# Patient Record
Sex: Male | Born: 1945 | Race: White | Hispanic: No | Marital: Married | Smoking: Never smoker
Health system: Southern US, Community
[De-identification: ages and names within clinical notes are randomized; demographics above are authoritative.]

## PROBLEM LIST (undated history)

## (undated) DIAGNOSIS — C61 Malignant neoplasm of prostate: Secondary | ICD-10-CM

## (undated) DIAGNOSIS — L814 Other melanin hyperpigmentation: Secondary | ICD-10-CM

## (undated) DIAGNOSIS — L111 Transient acantholytic dermatosis [Grover]: Secondary | ICD-10-CM

## (undated) DIAGNOSIS — E785 Hyperlipidemia, unspecified: Secondary | ICD-10-CM

## (undated) DIAGNOSIS — N400 Enlarged prostate without lower urinary tract symptoms: Secondary | ICD-10-CM

## (undated) DIAGNOSIS — I452 Bifascicular block: Secondary | ICD-10-CM

## (undated) DIAGNOSIS — R030 Elevated blood-pressure reading, without diagnosis of hypertension: Secondary | ICD-10-CM

## (undated) DIAGNOSIS — D1801 Hemangioma of skin and subcutaneous tissue: Secondary | ICD-10-CM

## (undated) DIAGNOSIS — Z973 Presence of spectacles and contact lenses: Secondary | ICD-10-CM

## (undated) DIAGNOSIS — L821 Other seborrheic keratosis: Secondary | ICD-10-CM

## (undated) DIAGNOSIS — N529 Male erectile dysfunction, unspecified: Secondary | ICD-10-CM

## (undated) HISTORY — PX: TONSILLECTOMY: SUR1361

## (undated) HISTORY — DX: Hemangioma of skin and subcutaneous tissue: D18.01

## (undated) HISTORY — DX: Other seborrheic keratosis: L82.1

## (undated) HISTORY — PX: KNEE ARTHROSCOPY: SUR90

## (undated) HISTORY — DX: Other melanin hyperpigmentation: L81.4

## (undated) HISTORY — DX: Transient acantholytic dermatosis (grover): L11.1

## (undated) HISTORY — PX: APPENDECTOMY: SHX54

## (undated) HISTORY — PX: SHOULDER ARTHROSCOPY WITH OPEN ROTATOR CUFF REPAIR: SHX6092

## (undated) HISTORY — PX: PROSTATE BIOPSY: SHX241

## (undated) HISTORY — PX: COLONOSCOPY: SHX174

---

## 1991-05-22 HISTORY — PX: INGUINAL HERNIA REPAIR: SUR1180

## 2003-11-11 ENCOUNTER — Ambulatory Visit (HOSPITAL_COMMUNITY): Admission: RE | Admit: 2003-11-11 | Discharge: 2003-11-11 | Payer: Self-pay

## 2011-05-22 DIAGNOSIS — C61 Malignant neoplasm of prostate: Secondary | ICD-10-CM | POA: Insufficient documentation

## 2011-05-22 HISTORY — DX: Malignant neoplasm of prostate: C61

## 2014-01-06 ENCOUNTER — Other Ambulatory Visit: Payer: Self-pay | Admitting: Urology

## 2014-01-06 DIAGNOSIS — C61 Malignant neoplasm of prostate: Secondary | ICD-10-CM

## 2014-02-02 ENCOUNTER — Ambulatory Visit (HOSPITAL_COMMUNITY)
Admission: RE | Admit: 2014-02-02 | Discharge: 2014-02-02 | Disposition: A | Discharge status: Home / Self Care | Payer: Medicare Other | Source: Ambulatory Visit | Attending: Urology | Admitting: Urology

## 2014-02-02 DIAGNOSIS — C61 Malignant neoplasm of prostate: Secondary | ICD-10-CM | POA: Diagnosis not present

## 2014-02-02 LAB — POCT I-STAT CREATININE: Creatinine, Ser: 1.1 mg/dL (ref 0.50–1.35)

## 2014-02-02 MED ORDER — GADOBENATE DIMEGLUMINE 529 MG/ML IV SOLN
20.0000 mL | Freq: Once | INTRAVENOUS | Status: AC | PRN
  Administered 2014-02-02: 16 mL via INTRAVENOUS

## 2015-10-04 DIAGNOSIS — N529 Male erectile dysfunction, unspecified: Secondary | ICD-10-CM | POA: Insufficient documentation

## 2015-10-04 DIAGNOSIS — E7800 Pure hypercholesterolemia, unspecified: Secondary | ICD-10-CM | POA: Insufficient documentation

## 2015-10-04 HISTORY — DX: Pure hypercholesterolemia, unspecified: E78.00

## 2015-10-04 HISTORY — DX: Male erectile dysfunction, unspecified: N52.9

## 2015-10-19 ENCOUNTER — Encounter: Payer: Self-pay | Admitting: Radiation Oncology

## 2015-10-19 NOTE — Progress Notes (Signed)
GU Location of Tumor / Histology: prostatic adenocarcinoma  Gleason score 6 in 2013. 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  William Lindsey STUDENT was initially diagnosed with prostate cancer in 2013 and place on surveillance  Biopsies of prostate (if applicable) revealed:      Past/Anticipated interventions by urology, if any: Multiple prostate biopsies. Proscar. Patient wishes to avoid future biopsies. Referral to Dr. Tammi Klippel  Past/Anticipated interventions by medical oncology, if any: no  Weight changes, if any: no  Bowel/Bladder complaints, if any: Denies hematuria or dysuria. Denies leakage or incontinence. IPSS 7. Reports Incomplete emptying and frequency less than 1 in 5. Reports urinary intermittency less than half the time. Reports urgency and weak stream less than 1 in 5. Reports nocturia x 1. Reports severe ED.  Nausea/Vomiting, if any: no  Pain issues, if any:  no  SAFETY ISSUES:  Prior radiation? no  Pacemaker/ICD? no  Possible current pregnancy? no  Is the patient on methotrexate? no  Current Complaints / other details:  70 year old male. Active playing golf several times per week. Prostate volume 73 cc. Daughter is a Pension scheme manager at [employer], Dr. Candice Camp. Also, patient has three sons. Patient questions if he should continue to take Proscar.

## 2015-10-20 ENCOUNTER — Ambulatory Visit
Admission: RE | Admit: 2015-10-20 | Discharge: 2015-10-20 | Disposition: A | Discharge status: Home / Self Care | Payer: Medicare Other | Source: Ambulatory Visit | Attending: Radiation Oncology | Admitting: Radiation Oncology

## 2015-10-20 ENCOUNTER — Encounter: Payer: Self-pay | Admitting: Radiation Oncology

## 2015-10-20 VITALS — BP 167/100 | HR 81 | Temp 98.1°F | Resp 16 | Ht 69.0 in | Wt 180.0 lb

## 2015-10-20 DIAGNOSIS — C61 Malignant neoplasm of prostate: Secondary | ICD-10-CM | POA: Diagnosis present

## 2015-10-20 DIAGNOSIS — Z510 Encounter for antineoplastic radiation therapy: Secondary | ICD-10-CM | POA: Insufficient documentation

## 2015-10-20 HISTORY — DX: Malignant neoplasm of prostate: C61

## 2015-10-20 NOTE — Progress Notes (Signed)
See progress note under physician encounter. 

## 2015-10-20 NOTE — Progress Notes (Signed)
Radiation Oncology         [phone] ________________________________  Initial outpatient Consultation  Name: William Lindsey MRN: [id number]  Date: 10/20/2015  DOB: 01-Nov-1945  [id number], MD  Alexis Frock, MD   REFERRING PHYSICIAN: Alexis Frock, MD  DIAGNOSIS: 70 y.o. gentleman with stage T1c adenocarcinoma of the prostate with a Gleason's score of 3+3 and a PSA of 7.96    ICD-9-CM ICD-10-CM   1. Malignant neoplasm of prostate ([place]) 185 C61   2. Prostate cancer ([place]) William Lindsey is a 70 y.o. gentleman with a history of prostate cancer originally diagnosed in 2013. His PSA in 2012 was found to be 4.12 which prompted an evaluation, though ultimately revealed a negative prostatic biopsy. He was followed with repeat PSA, and in May 2013, his PSA rose to 5.69, he had a Gleason 6 cancer in the Right lobe medially. He continued in surveillance and a PSA in August 2015 was 6.91 and a Prostate MRI at that time showed no focal lesions or evidence of metastases.  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 His prostatic volume measured 73 cc. He was started on Finasteride during that time, and despite initial decline of his PSA to 4.17 in November 2016, this has risen to 5.75 on 09/26/15. He reports that he was encourage to consider repeat biopsy, though states that he is at that point where he is ready to move forward with an active treatment rather than continue in surveillance, and because of the change in PSA while on Finasteride, he comes to further discuss options for intervention with Dr. Tammi Klippel. Of note, he has decided against moving forward with surgery because of his concerns postoperative incontinence and erectile function.  PREVIOUS RADIATION THERAPY: No  PAST MEDICAL HISTORY: Family History  Problem Relation Age of Onset  . Lung cancer Father   . Breast cancer Sister      PAST  SURGICAL HISTORY: Past Surgical History  Procedure Laterality Date  . Prostate biopsy      multiple    FAMILY HISTORY:  Family History  Problem Relation Age of Onset  . Lung cancer Father   . Breast cancer Sister     SOCIAL HISTORY:  reports that he has never smoked. He has never used smokeless tobacco. He reports that he does not drink alcohol or use illicit drugs. The patient is retired from working in Scientist, research (medical). He enjoys golfing. He is married and resides in [place] with his wife. His daughter is a Pension scheme manager in [place] at [employer].  ALLERGIES: Review of patient's allergies indicates no known allergies.  MEDICATIONS:  Current Outpatient Prescriptions  Medication Sig Dispense Refill  . atorvastatin (LIPITOR) 20 MG tablet Take by mouth.    . finasteride (PROSCAR) 5 MG tablet     . tadalafil (CIALIS) 5 MG tablet Take 5 mg by mouth.     No current facility-administered medications for this encounter.    REVIEW OF SYSTEMS:  On review of systems, the patient reports that he is doing well overall. He denies any chest pain, shortness of breath, cough, fevers, chills, night sweats, unintended weight changes. He denies abdominal pain, nausea or vomiting. He denies any new musculoskeletal or joint aches or pains. Denies hematuria or dysuria. Denies leakage or incontinence. Reports Incomplete emptying and frequency less than 1 in 5. Reports urinary intermittency less than half the time.  Reports urgency and weak stream less than 1 in 5. Reports nocturia x 1. Reports severe ED. A complete review of systems is obtained and is otherwise negative. The patient completed an IPSS and IIEF questionnaire.  His IPSS score was 7 indicating mild urinary outflow obstructive symptoms.  He indicated that his erectile function is poor and his ability to complete sexual activity is infrequent. A complete review of systems is obtained and is otherwise negative.   PHYSICAL EXAM:    height is 5\' 9"  (1.753  m) and weight is 180 lb (81.647 kg). His oral temperature is 98.1 F (36.7 C). His blood pressure is 167/100 and his pulse is 81. His respiration is 16 and oxygen saturation is 100%.   Pain scale 0/10 In general this is a well appearing caucasian male in no acute distress. He is alert and oriented x4 and appropriate throughout the examination. HEENT reveals that the patient is normocephalic, atraumatic. EOMs are intact. PERRLA. Skin is intact without any evidence of gross lesions. Cardiovascular exam reveals a regular rate and rhythm, no clicks rubs or murmurs are auscultated. Chest is clear to auscultation bilaterally. Lymphatic assessment is performed and does not reveal any adenopathy in the cervical, supraclavicular, axillary, or inguinal chains. Abdomen has active bowel sounds in all quadrants and is intact. The abdomen is soft, non tender, non distended. Lower extremities are negative for pretibial pitting edema, deep calf tenderness, cyanosis or clubbing.   KPS = 100  100 - Normal; no complaints; no evidence of disease. 90   - Able to carry on normal activity; minor signs or symptoms of disease. 80   - Normal activity with effort; some signs or symptoms of disease. 82   - Cares for self; unable to carry on normal activity or to do active work. 60   - Requires occasional assistance, but is able to care for most of his personal needs. 50   - Requires considerable assistance and frequent medical care. 26   - Disabled; requires special care and assistance. 72   - Severely disabled; hospital admission is indicated although death not imminent. 44   - Very sick; hospital admission necessary; active supportive treatment necessary. 10   - Moribund; fatal processes progressing rapidly. 0     - Dead  Karnofsky DA, Abelmann WH, Craver LS and Burchenal JH [phone]) The use of the nitrogen mustards in the palliative treatment of carcinoma: with particular reference to bronchogenic carcinoma Cancer 1  [id number]   LABORATORY DATA:  No results found for: WBC, HGB, HCT, MCV, PLT No results found for: NA, K, CL, CO2 No results found for: ALT, AST, GGT, ALKPHOS, BILITOT   RADIOGRAPHY: No results found.    IMPRESSION: This gentleman is a very pleasant 70 year old with a T1c Adenocarcinoma of the prostate with a Gleason's score of 3+3, and PSA of 5.75 on finasteride (though with this correction, his PSA is more likely in the 10 range).  His T-Stage, Gleason's Score, and corrected PSA put him into the intermediate risk group.  Accordingly he is eligible for a variety of potential treatment options including prostatectomy, seed implant, or external radiothearpy.  PLAN:  Dr. Tammi Klippel again reviews the patient's history, biopsies, and PSA levels. He reviews again the options for surgery which the patient is not interested in, and highlights discussion on radiotherapy options. He discusses the risks, benefits, short, and long term effects of radiation therapy in each modality of either brachytherapy with seed implant versus external radiotherapy.  He discusses the planning that goes into seed implant with a pubic arch study and preoperative testing, and reviews that alternatively, external radiotherapy would be for a total 40 fractions over 8 weeks, and that fiducial markers would need to be placed prior to this procedure.    After consideration, the patient would like to proceed with prostate brachytherapy. We will speak with Dr. Tresa Moore about scheduling a TRUS, as we would like his volume to be closer to 60cc prior to proceeding, and hopefully the finasteride may have helped in reduction of this volume.  Dr. Tammi Klippel will be in touch with the patient's daughter, Dr. Candice Camp, radiation oncologist at [facility], later this morning. We enjoyed meeting with him today, and will look forward to participating in the care of this very nice gentleman.  The above documentation reflects my direct findings during this shared  patient visit. Please see the separate note by Dr. Tammi Klippel on this date for the remainder of the patient's plan of care.    Carola Rhine, PAC     This document serves as a record of services personally performed by Tyler Pita, MD and Carola Rhine, PAC. It was created on his behalf by Arlyce Harman, a trained medical scribe. The creation of this record is based on the scribe's personal observations and the provider's statements to them. This document has been checked and approved by the attending provider.

## 2015-10-21 DIAGNOSIS — C61 Malignant neoplasm of prostate: Secondary | ICD-10-CM | POA: Insufficient documentation

## 2015-10-26 ENCOUNTER — Telehealth: Payer: Self-pay | Admitting: *Deleted

## 2015-10-26 NOTE — Telephone Encounter (Signed)
[facility] called, patient's appt prostate bx is scheduled for July 17,2017 patient to be there at [facility] for 930am bx, once Dr. Tresa Moore puts order in  She will call from their office to go over everything  With him, gave information to [facility] assistant\3:17 PM

## 2015-12-05 ENCOUNTER — Other Ambulatory Visit: Payer: Self-pay | Admitting: Radiation Oncology

## 2015-12-05 ENCOUNTER — Telehealth: Payer: Self-pay | Admitting: *Deleted

## 2015-12-05 NOTE — Telephone Encounter (Signed)
CALLED PATIENT TO INFORM OF PRE-SEED APPT. ON 12-09-15 @ 3 PM , LVM FOR A RETURN CALL

## 2015-12-08 ENCOUNTER — Telehealth: Payer: Self-pay | Admitting: *Deleted

## 2015-12-08 NOTE — Telephone Encounter (Signed)
CALLED PATIENT TO REMIND OF PRE-SEED APPT. FOR 12-09-15, SPOKE WITH PATIENT AND HE IS AWARE OF THIS APPT.

## 2015-12-08 NOTE — Telephone Encounter (Signed)
CALLED PATIENT TO ASK ABOUT COMING EARLIER TOMORROW FOR PRE-SEED APPT. ON 12-09-15 @ 1 PM, SPOKE WITH PATIENT AND HE AGREED TO THIS

## 2015-12-09 ENCOUNTER — Ambulatory Visit
Admission: RE | Admit: 2015-12-09 | Discharge: 2015-12-09 | Disposition: A | Discharge status: Home / Self Care | Payer: Medicare Other | Source: Ambulatory Visit | Attending: Radiation Oncology | Admitting: Radiation Oncology

## 2015-12-09 ENCOUNTER — Ambulatory Visit (HOSPITAL_BASED_OUTPATIENT_CLINIC_OR_DEPARTMENT_OTHER)
Admission: RE | Admit: 2015-12-09 | Discharge: 2015-12-09 | Disposition: A | Discharge status: Home / Self Care | Payer: Medicare Other | Source: Ambulatory Visit | Attending: Urology | Admitting: Urology

## 2015-12-09 ENCOUNTER — Other Ambulatory Visit: Payer: Self-pay

## 2015-12-09 ENCOUNTER — Encounter (HOSPITAL_BASED_OUTPATIENT_CLINIC_OR_DEPARTMENT_OTHER)
Admission: RE | Admit: 2015-12-09 | Discharge: 2015-12-09 | Disposition: A | Discharge status: Home / Self Care | Payer: Medicare Other | Source: Ambulatory Visit | Attending: Urology | Admitting: Urology

## 2015-12-09 DIAGNOSIS — I515 Myocardial degeneration: Secondary | ICD-10-CM | POA: Diagnosis not present

## 2015-12-09 DIAGNOSIS — C61 Malignant neoplasm of prostate: Secondary | ICD-10-CM

## 2015-12-09 DIAGNOSIS — Z01818 Encounter for other preprocedural examination: Secondary | ICD-10-CM | POA: Diagnosis not present

## 2015-12-09 DIAGNOSIS — Z510 Encounter for antineoplastic radiation therapy: Secondary | ICD-10-CM | POA: Diagnosis not present

## 2015-12-09 NOTE — Progress Notes (Signed)
  Radiation Oncology         [phone] ________________________________  Name: CEOLA MANIA MRN: [id number]  Date: 12/09/2015  DOB: February 05, 1946  SIMULATION AND TREATMENT PLANNING NOTE PUBIC ARCH STUDY  [id number], MD  Alexis Frock, MD  DIAGNOSIS: 70 y.o. gentleman with stage T1c adenocarcinoma of the prostate with a Gleason's score of 3+3 and a PSA of 7.96     ICD-9-CM ICD-10-CM   1. Prostate cancer ([place]) [place]:  The patient presented today for evaluation for possible prostate seed implant. He was brought to the radiation planning suite and placed supine on the CT couch. A 3-dimensional image study set was obtained in upload to the planning computer. There, on each axial slice, I contoured the prostate gland. Then, using three-dimensional radiation planning tools I reconstructed the prostate in view of the structures from the transperineal needle pathway to assess for possible pubic arch interference. In doing so, I did not appreciate any pubic arch interference. Also, the patient's prostate volume was estimated based on the drawn structure. The volume was 53 cc.  Given the pubic arch appearance and prostate volume, patient remains a good candidate to proceed with prostate seed implant. Today, he freely provided informed written consent to proceed.    PLAN: The patient will undergo prostate seed implant.   ________________________________  Sheral Apley. Tammi Klippel, M.D.

## 2016-01-17 ENCOUNTER — Ambulatory Visit: Admission: RE | Admit: 2016-01-17 | Payer: Medicare Other | Source: Ambulatory Visit

## 2016-01-18 ENCOUNTER — Telehealth: Payer: Self-pay | Admitting: *Deleted

## 2016-01-18 NOTE — Telephone Encounter (Signed)
CALLED PATIENT TO REMIND OF LABS FOR IMPLANT ON 01-26-16, SPOKE WITH PATIENT'S WIFE AND SHE IS AWARE OF THIS APPT.

## 2016-01-19 DIAGNOSIS — N401 Enlarged prostate with lower urinary tract symptoms: Secondary | ICD-10-CM | POA: Diagnosis not present

## 2016-01-19 DIAGNOSIS — Z79899 Other long term (current) drug therapy: Secondary | ICD-10-CM | POA: Diagnosis not present

## 2016-01-19 DIAGNOSIS — I452 Bifascicular block: Secondary | ICD-10-CM | POA: Diagnosis not present

## 2016-01-19 DIAGNOSIS — C61 Malignant neoplasm of prostate: Secondary | ICD-10-CM | POA: Diagnosis not present

## 2016-01-19 DIAGNOSIS — E785 Hyperlipidemia, unspecified: Secondary | ICD-10-CM | POA: Diagnosis not present

## 2016-01-19 DIAGNOSIS — R350 Frequency of micturition: Secondary | ICD-10-CM | POA: Diagnosis not present

## 2016-01-19 LAB — CBC
HCT: 42.4 % (ref 39.0–52.0)
HEMOGLOBIN: 14.5 g/dL (ref 13.0–17.0)
MCH: 30.5 pg (ref 26.0–34.0)
MCHC: 34.2 g/dL (ref 30.0–36.0)
MCV: 89.3 fL (ref 78.0–100.0)
PLATELETS: 250 10*3/uL (ref 150–400)
RBC: 4.75 MIL/uL (ref 4.22–5.81)
RDW: 12.6 % (ref 11.5–15.5)
WBC: 6.3 10*3/uL (ref 4.0–10.5)

## 2016-01-19 LAB — COMPREHENSIVE METABOLIC PANEL
ALBUMIN: 4.1 g/dL (ref 3.5–5.0)
ALK PHOS: 63 U/L (ref 38–126)
ALT: 21 U/L (ref 17–63)
AST: 22 U/L (ref 15–41)
Anion gap: 5 (ref 5–15)
BUN: 14 mg/dL (ref 6–20)
CALCIUM: 9 mg/dL (ref 8.9–10.3)
CHLORIDE: 107 mmol/L (ref 101–111)
CO2: 28 mmol/L (ref 22–32)
CREATININE: 1.27 mg/dL — AB (ref 0.61–1.24)
GFR calc Af Amer: >60 mL/min (ref >60)
GFR calc non Af Amer: 55 mL/min — ABNORMAL LOW (ref >60)
GLUCOSE: 115 mg/dL — AB (ref 65–99)
Potassium: 3.9 mmol/L (ref 3.5–5.1)
SODIUM: 140 mmol/L (ref 135–145)
Total Bilirubin: 1 mg/dL (ref 0.3–1.2)
Total Protein: 8 g/dL (ref 6.5–8.1)

## 2016-01-19 LAB — PROTIME-INR
INR: 1.06
Prothrombin Time: 13.8 seconds (ref 11.4–15.2)

## 2016-01-19 LAB — APTT: APTT: 25 s (ref 24–36)

## 2016-01-20 ENCOUNTER — Encounter (HOSPITAL_BASED_OUTPATIENT_CLINIC_OR_DEPARTMENT_OTHER): Payer: Self-pay | Admitting: *Deleted

## 2016-01-20 NOTE — Progress Notes (Signed)
NPO AFTER MN.  ARRIVE AT 0800.  CURRENT LAB RESULTS, EKG, AND CXR IN CHART AND EPIC.  WILL DO FLEET ENEMA AM DOS .

## 2016-01-25 ENCOUNTER — Telehealth: Payer: Self-pay | Admitting: *Deleted

## 2016-01-25 NOTE — Telephone Encounter (Signed)
CALLED PATIENT TO REMIND OF PROCEDURE FOR 01-26-16, SPOKE WITH PATIENT AND HE IS AWARE OF THIS PROCEDURE

## 2016-01-26 ENCOUNTER — Ambulatory Visit (HOSPITAL_COMMUNITY): Payer: Medicare Other

## 2016-01-26 ENCOUNTER — Ambulatory Visit (HOSPITAL_BASED_OUTPATIENT_CLINIC_OR_DEPARTMENT_OTHER)
Admission: RE | Admit: 2016-01-26 | Discharge: 2016-01-26 | Disposition: A | Discharge status: Home / Self Care | Payer: Medicare Other | Source: Ambulatory Visit | Attending: Urology | Admitting: Urology

## 2016-01-26 ENCOUNTER — Encounter (HOSPITAL_BASED_OUTPATIENT_CLINIC_OR_DEPARTMENT_OTHER)
Admission: RE | Disposition: A | Discharge status: Home / Self Care | Payer: Self-pay | Source: Ambulatory Visit | Attending: Urology

## 2016-01-26 ENCOUNTER — Ambulatory Visit (HOSPITAL_BASED_OUTPATIENT_CLINIC_OR_DEPARTMENT_OTHER): Payer: Medicare Other | Admitting: Anesthesiology

## 2016-01-26 ENCOUNTER — Encounter (HOSPITAL_BASED_OUTPATIENT_CLINIC_OR_DEPARTMENT_OTHER): Payer: Self-pay | Admitting: Anesthesiology

## 2016-01-26 DIAGNOSIS — Z01818 Encounter for other preprocedural examination: Secondary | ICD-10-CM

## 2016-01-26 DIAGNOSIS — R350 Frequency of micturition: Secondary | ICD-10-CM | POA: Insufficient documentation

## 2016-01-26 DIAGNOSIS — C61 Malignant neoplasm of prostate: Secondary | ICD-10-CM | POA: Diagnosis not present

## 2016-01-26 DIAGNOSIS — I452 Bifascicular block: Secondary | ICD-10-CM | POA: Diagnosis not present

## 2016-01-26 DIAGNOSIS — N401 Enlarged prostate with lower urinary tract symptoms: Secondary | ICD-10-CM | POA: Insufficient documentation

## 2016-01-26 DIAGNOSIS — E785 Hyperlipidemia, unspecified: Secondary | ICD-10-CM | POA: Diagnosis not present

## 2016-01-26 DIAGNOSIS — Z79899 Other long term (current) drug therapy: Secondary | ICD-10-CM | POA: Insufficient documentation

## 2016-01-26 HISTORY — DX: Male erectile dysfunction, unspecified: N52.9

## 2016-01-26 HISTORY — DX: Presence of spectacles and contact lenses: Z97.3

## 2016-01-26 HISTORY — DX: Hyperlipidemia, unspecified: E78.5

## 2016-01-26 HISTORY — DX: Benign prostatic hyperplasia without lower urinary tract symptoms: N40.0

## 2016-01-26 HISTORY — PX: RADIOACTIVE SEED IMPLANT: SHX5150

## 2016-01-26 HISTORY — DX: Bifascicular block: I45.2

## 2016-01-26 HISTORY — DX: Elevated blood-pressure reading, without diagnosis of hypertension: R03.0

## 2016-01-26 SURGERY — INSERTION, RADIATION SOURCE, PROSTATE
Anesthesia: General | Site: Prostate

## 2016-01-26 MED ORDER — DEXAMETHASONE SODIUM PHOSPHATE 4 MG/ML IJ SOLN
INTRAMUSCULAR | Status: DC | PRN
  Administered 2016-01-26: 10 mg via INTRAVENOUS

## 2016-01-26 MED ORDER — TRAMADOL HCL 50 MG PO TABS: 50.0000 mg | ORAL_TABLET | Freq: Four times a day (QID) | ORAL | 0 refills | Status: DC | PRN

## 2016-01-26 MED ORDER — CEFAZOLIN SODIUM 1 G IJ SOLR
INTRAMUSCULAR | Status: AC
  Filled 2016-01-26: qty 10

## 2016-01-26 MED ORDER — FENTANYL CITRATE (PF) 100 MCG/2ML IJ SOLN
INTRAMUSCULAR | Status: DC | PRN
  Administered 2016-01-26 (×2): 50 ug via INTRAVENOUS

## 2016-01-26 MED ORDER — LACTATED RINGERS IV SOLN
INTRAVENOUS | Status: DC
Start: 2016-01-26 — End: 2016-01-26
  Administered 2016-01-26 (×3): via INTRAVENOUS
  Filled 2016-01-26: qty 1000

## 2016-01-26 MED ORDER — LIDOCAINE 2% (20 MG/ML) 5 ML SYRINGE
INTRAMUSCULAR | Status: AC
  Filled 2016-01-26: qty 15

## 2016-01-26 MED ORDER — CEFAZOLIN IN D5W 1 GM/50ML IV SOLN
INTRAVENOUS | Status: DC | PRN
  Administered 2016-01-26: 1 g via INTRAVENOUS

## 2016-01-26 MED ORDER — IOHEXOL 300 MG/ML  SOLN
INTRAMUSCULAR | Status: DC | PRN
  Administered 2016-01-26: 7 mL

## 2016-01-26 MED ORDER — DEXAMETHASONE SODIUM PHOSPHATE 10 MG/ML IJ SOLN
INTRAMUSCULAR | Status: AC
  Filled 2016-01-26: qty 1

## 2016-01-26 MED ORDER — MIDAZOLAM HCL 5 MG/5ML IJ SOLN
INTRAMUSCULAR | Status: DC | PRN
  Administered 2016-01-26: 2 mg via INTRAVENOUS

## 2016-01-26 MED ORDER — LIDOCAINE 2% (20 MG/ML) 5 ML SYRINGE
INTRAMUSCULAR | Status: DC | PRN
  Administered 2016-01-26: 80 mg via INTRAVENOUS

## 2016-01-26 MED ORDER — MEPERIDINE HCL 25 MG/ML IJ SOLN
6.2500 mg | INTRAMUSCULAR | Status: DC | PRN
  Filled 2016-01-26: qty 1

## 2016-01-26 MED ORDER — PROPOFOL 10 MG/ML IV BOLUS
INTRAVENOUS | Status: AC
  Filled 2016-01-26: qty 20

## 2016-01-26 MED ORDER — PROPOFOL 500 MG/50ML IV EMUL
INTRAVENOUS | Status: DC | PRN
  Administered 2016-01-26: 25 ug/kg/min via INTRAVENOUS

## 2016-01-26 MED ORDER — ONDANSETRON HCL 4 MG/2ML IJ SOLN
4.0000 mg | Freq: Once | INTRAMUSCULAR | Status: DC | PRN
  Filled 2016-01-26: qty 2

## 2016-01-26 MED ORDER — CIPROFLOXACIN IN D5W 400 MG/200ML IV SOLN
INTRAVENOUS | Status: AC
  Filled 2016-01-26: qty 200

## 2016-01-26 MED ORDER — ONDANSETRON HCL 4 MG/2ML IJ SOLN
INTRAMUSCULAR | Status: DC | PRN
  Administered 2016-01-26: 4 mg via INTRAVENOUS

## 2016-01-26 MED ORDER — HYDROMORPHONE HCL 1 MG/ML IJ SOLN
0.2500 mg | INTRAMUSCULAR | Status: DC | PRN
  Filled 2016-01-26: qty 1

## 2016-01-26 MED ORDER — PROPOFOL 10 MG/ML IV BOLUS
INTRAVENOUS | Status: DC | PRN
  Administered 2016-01-26: 170 mg via INTRAVENOUS
  Administered 2016-01-26: 30 mg via INTRAVENOUS

## 2016-01-26 MED ORDER — FENTANYL CITRATE (PF) 100 MCG/2ML IJ SOLN
INTRAMUSCULAR | Status: AC
  Filled 2016-01-26: qty 2

## 2016-01-26 MED ORDER — PROPOFOL 10 MG/ML IV BOLUS
INTRAVENOUS | Status: AC
  Filled 2016-01-26: qty 40

## 2016-01-26 MED ORDER — ARTIFICIAL TEARS OP OINT
TOPICAL_OINTMENT | OPHTHALMIC | Status: AC
  Filled 2016-01-26: qty 3.5

## 2016-01-26 MED ORDER — FLEET ENEMA 7-19 GM/118ML RE ENEM
1.0000 | ENEMA | Freq: Once | RECTAL | Status: AC
  Administered 2016-01-26: 1 via RECTAL
  Filled 2016-01-26: qty 1

## 2016-01-26 MED ORDER — MIDAZOLAM HCL 2 MG/2ML IJ SOLN
INTRAMUSCULAR | Status: AC
  Filled 2016-01-26: qty 2

## 2016-01-26 MED ORDER — KETOROLAC TROMETHAMINE 30 MG/ML IJ SOLN
INTRAMUSCULAR | Status: DC | PRN
  Administered 2016-01-26: 30 mg via INTRAVENOUS

## 2016-01-26 MED ORDER — CIPROFLOXACIN IN D5W 400 MG/200ML IV SOLN
400.0000 mg | INTRAVENOUS | Status: AC
  Administered 2016-01-26: 400 mg via INTRAVENOUS
  Filled 2016-01-26: qty 200

## 2016-01-26 MED ORDER — KETOROLAC TROMETHAMINE 30 MG/ML IJ SOLN
INTRAMUSCULAR | Status: AC
  Filled 2016-01-26: qty 1

## 2016-01-26 MED ORDER — PHENYLEPHRINE 40 MCG/ML (10ML) SYRINGE FOR IV PUSH (FOR BLOOD PRESSURE SUPPORT)
PREFILLED_SYRINGE | INTRAVENOUS | Status: AC
  Filled 2016-01-26: qty 10

## 2016-01-26 MED ORDER — ONDANSETRON HCL 4 MG/2ML IJ SOLN
INTRAMUSCULAR | Status: AC
  Filled 2016-01-26: qty 2

## 2016-01-26 MED ORDER — EPHEDRINE SULFATE 50 MG/ML IJ SOLN
INTRAMUSCULAR | Status: DC | PRN
  Administered 2016-01-26 (×3): 10 mg via INTRAVENOUS

## 2016-01-26 SURGICAL SUPPLY — 35 items
BAG URINE DRAINAGE (UROLOGICAL SUPPLIES) ×3 IMPLANT
BLADE CLIPPER SURG (BLADE) ×3 IMPLANT
CATH FOLEY 2WAY SLVR  5CC 16FR (CATHETERS) ×2
CATH FOLEY 2WAY SLVR 5CC 16FR (CATHETERS) ×2 IMPLANT
CATH ROBINSON RED A/P 20FR (CATHETERS) ×3 IMPLANT
CLOTH BEACON ORANGE TIMEOUT ST (SAFETY) ×3 IMPLANT
COVER BACK TABLE 60X90IN (DRAPES) ×3 IMPLANT
COVER MAYO STAND STRL (DRAPES) ×3 IMPLANT
DRSG TEGADERM 4X4.75 (GAUZE/BANDAGES/DRESSINGS) ×3 IMPLANT
DRSG TEGADERM 8X12 (GAUZE/BANDAGES/DRESSINGS) ×3 IMPLANT
GLOVE BIO SURGEON STRL SZ 6.5 (GLOVE) ×1 IMPLANT
GLOVE BIO SURGEON STRL SZ7.5 (GLOVE) ×2 IMPLANT
GLOVE BIO SURGEONS STRL SZ 6.5 (GLOVE) ×1
GLOVE BIOGEL PI IND STRL 6.5 (GLOVE) IMPLANT
GLOVE BIOGEL PI IND STRL 7.5 (GLOVE) IMPLANT
GLOVE BIOGEL PI INDICATOR 6.5 (GLOVE) ×4
GLOVE BIOGEL PI INDICATOR 7.5 (GLOVE) ×2
GLOVE ECLIPSE 8.0 STRL XLNG CF (GLOVE) ×18 IMPLANT
GOWN STRL REUS W/ TWL LRG LVL3 (GOWN DISPOSABLE) ×1 IMPLANT
GOWN STRL REUS W/ TWL XL LVL3 (GOWN DISPOSABLE) ×1 IMPLANT
GOWN STRL REUS W/TWL LRG LVL3 (GOWN DISPOSABLE) ×3
GOWN STRL REUS W/TWL XL LVL3 (GOWN DISPOSABLE) ×3
HOLDER FOLEY CATH W/STRAP (MISCELLANEOUS) ×3 IMPLANT
IV NS 1000ML (IV SOLUTION) ×3
IV NS 1000ML BAXH (IV SOLUTION) ×1 IMPLANT
KIT ROOM TURNOVER WOR (KITS) ×3 IMPLANT
MANIFOLD NEPTUNE II (INSTRUMENTS) IMPLANT
NUCLETRON SELECTSEED 1-125 (Urological Implant) ×156 IMPLANT
PACK CYSTO (CUSTOM PROCEDURE TRAY) ×3 IMPLANT
SPONGE GAUZE 4X4 12PLY STER LF (GAUZE/BANDAGES/DRESSINGS) ×2 IMPLANT
SYRINGE 10CC LL (SYRINGE) ×3 IMPLANT
TUBE CONNECTING 12'X1/4 (SUCTIONS)
TUBE CONNECTING 12X1/4 (SUCTIONS) IMPLANT
UNDERPAD 30X30 INCONTINENT (UNDERPADS AND DIAPERS) ×6 IMPLANT
WATER STERILE IRR 500ML POUR (IV SOLUTION) ×3 IMPLANT

## 2016-01-26 NOTE — Anesthesia Procedure Notes (Signed)
Procedure Name: LMA Insertion Date/Time: 01/26/2016 10:06 AM Performed by: Wanita Chamberlain Pre-anesthesia Checklist: Patient identified, Timeout performed, Emergency Drugs available, Suction available and Patient being monitored Patient Re-evaluated:Patient Re-evaluated prior to inductionOxygen Delivery Method: Circle system utilized Preoxygenation: Pre-oxygenation with 100% oxygen Intubation Type: IV induction Ventilation: Mask ventilation without difficulty LMA: LMA inserted LMA Size: 4.0 Number of attempts: 1 Placement Confirmation: positive ETCO2 Tube secured with: Tape

## 2016-01-26 NOTE — Anesthesia Postprocedure Evaluation (Signed)
Anesthesia Post Note  Patient: William Lindsey  Procedure(s) Performed: Procedure(s) (LRB): RADIOACTIVE SEED IMPLANT/BRACHYTHERAPY IMPLANT (N/A)  Patient location during evaluation: PACU Anesthesia Type: General Level of consciousness: awake and alert Pain management: pain level controlled Vital Signs Assessment: post-procedure vital signs reviewed and stable Respiratory status: spontaneous breathing, nonlabored ventilation, respiratory function stable and patient connected to nasal cannula oxygen Cardiovascular status: blood pressure returned to baseline and stable Postop Assessment: no signs of nausea or vomiting Anesthetic complications: no    Last Vitals:  Vitals:   01/26/16 1245 01/26/16 1342  BP: (!) 144/89 (!) 148/85  Pulse: 75 76  Resp: 16 16  Temp:  36.6 C    Last Pain:  Vitals:   01/26/16 1245  TempSrc:   PainSc: 0-No pain                 William Lindsey William Lindsey

## 2016-01-26 NOTE — Anesthesia Preprocedure Evaluation (Addendum)
Anesthesia Evaluation  Patient identified by MRN, date of birth, ID band Patient awake    Reviewed: Allergy & Precautions, NPO status , Patient's Chart, lab work & pertinent test results  Airway Mallampati: I  TM Distance: >3 FB Neck ROM: Full    Dental  (+) Teeth Intact, Dental Advisory Given, Loose,    Pulmonary    Pulmonary exam normal        Cardiovascular Exercise Tolerance: Good Normal cardiovascular exam+ dysrhythmias   Bifascicular block   Neuro/Psych negative neurological ROS  negative psych ROS   GI/Hepatic negative GI ROS, Neg liver ROS,   Endo/Other  negative endocrine ROS  Renal/GU Prostate CA     Musculoskeletal negative musculoskeletal ROS (+)   Abdominal   Peds  Hematology negative hematology ROS (+)   Anesthesia Other Findings   Reproductive/Obstetrics negative OB ROS                           Anesthesia Physical Anesthesia Plan  ASA: II  Anesthesia Plan: General   Post-op Pain Management:    Induction: Intravenous  Airway Management Planned: LMA  Additional Equipment:   Intra-op Plan:   Post-operative Plan: Extubation in OR  Informed Consent: I have reviewed the patients History and Physical, chart, labs and discussed the procedure including the risks, benefits and alternatives for the proposed anesthesia with the patient or authorized representative who has indicated his/her understanding and acceptance.     Plan Discussed with: CRNA and Surgeon  Anesthesia Plan Comments:         Anesthesia Quick Evaluation

## 2016-01-26 NOTE — Progress Notes (Signed)
  Radiation Oncology         [phone] ________________________________  Name: William Lindsey MRN: [id number]  Date: 01/26/2016  DOB: 1945/11/17       Prostate Seed Implant  [id number], MD  No ref. provider found  DIAGNOSIS:  70 y.o. gentleman with stage T1c adenocarcinoma of the prostate with a Gleason's score of 3+3 and a PSA of 7.96     ICD-9-CM ICD-10-CM   1. Pre-op testing V72.84 Z01.818 DG Chest 2 View     DG Chest 2 View    PROCEDURE: Insertion of radioactive I-125 seeds into the prostate gland.  RADIATION DOSE: 145 Gy, definitive therapy.  TECHNIQUE: [name] was brought to the operating room with the urologist. He was placed in the dorsolithotomy position. He was catheterized and a rectal tube was inserted. The perineum was shaved, prepped and draped. The ultrasound probe was then introduced into the rectum to see the prostate gland.  TREATMENT DEVICE: A needle grid was attached to the ultrasound probe stand and anchor needles were placed.  3D PLANNING: The prostate was imaged in 3D using a sagittal sweep of the prostate probe. These images were transferred to the planning computer. There, the prostate, urethra and rectum were defined on each axial reconstructed image. Then, the software created an optimized 3D plan and a few seed positions were adjusted. The quality of the plan was reviewed using [facility] information for the target and the following two organs at risk:  Urethra and Rectum.  Then the accepted plan was uploaded to the seed Selectron afterloading unit.  PROSTATE VOLUME STUDY:  Using transrectal ultrasound the volume of the prostate was verified to be 65 cc.  SPECIAL TREATMENT PROCEDURE/SUPERVISION AND HANDLING: The Nucletron FIRST system was used to place the needles under sagittal guidance. A total of 23 needles were used to deposit 78 seeds in the prostate gland. The individual seed activity was 0.546 mCi.  COMPLEX SIMULATION: At the end of the  procedure, an anterior radiograph of the pelvis was obtained to document seed positioning and count. Cystoscopy was performed to check the urethra and bladder.  MICRODOSIMETRY: At the end of the procedure, the patient was emitting 0.24 mR/hr at 1 meter. Accordingly, he was considered safe for hospital discharge.  PLAN: The patient will return to the radiation oncology clinic for post implant CT dosimetry in three weeks.   ________________________________  Sheral Apley Tammi Klippel, M.D.

## 2016-01-26 NOTE — Brief Op Note (Signed)
01/26/2016  11:37 AM  PATIENT:  William Lindsey  70 y.o. male  PRE-OPERATIVE DIAGNOSIS:  PROSTATE CANCER  POST-OPERATIVE DIAGNOSIS:  PROSTATE CANCER  PROCEDURE:  Procedure(s): RADIOACTIVE SEED IMPLANT/BRACHYTHERAPY IMPLANT (N/A)  SURGEON:  Surgeon(s) and Role:    * William Frock, MD - Primary    * Tyler Pita, MD - Assisting  PHYSICIAN ASSISTANT:   ASSISTANTS: none   ANESTHESIA:   general  EBL:  Total I/O In: 1000 [I.V.:1000] Out: 30 [Urine:25; Blood:5]  BLOOD ADMINISTERED:none  DRAINS: none   LOCAL MEDICATIONS USED:  NONE  SPECIMEN:  No Specimen  DISPOSITION OF SPECIMEN:  N/A  COUNTS:  YES  TOURNIQUET:  * No tourniquets in log *  DICTATION: .Other Dictation: Dictation Number [id number]  PLAN OF CARE: Discharge to home after PACU  PATIENT DISPOSITION:  PACU - hemodynamically stable.   Delay start of Pharmacological VTE agent (>24hrs) due to surgical blood loss or risk of bleeding: yes

## 2016-01-26 NOTE — Transfer of Care (Signed)
Immediate Anesthesia Transfer of Care Note  Patient: William Lindsey  Procedure(s) Performed: Procedure(s): RADIOACTIVE SEED IMPLANT/BRACHYTHERAPY IMPLANT (N/A)  Patient Location: PACU  Anesthesia Type:General  Level of Consciousness: sedated and patient cooperative  Airway & Oxygen Therapy: Patient Spontanous Breathing and Patient connected to nasal cannula oxygen  Post-op Assessment: Report given to RN and Post -op Vital signs reviewed and stable  Post vital signs: Reviewed and stable  Last Vitals:  Vitals:   01/26/16 0815  BP: (!) 158/86  Pulse: 80  Resp: 16  Temp: 37.2 C    Last Pain:  Vitals:   01/26/16 0815  TempSrc: Oral      Patients Stated Pain Goal: 6 ([phone])  Complications: No apparent anesthesia complications

## 2016-01-26 NOTE — Discharge Instructions (Signed)
1 - You may have urinary urgency (bladder spasms) and bloody urine on / off  X few days. This is normal.  2 - Call MD or go to ER for fever >102, severe pain / nausea / vomiting not relieved by medications, inability to pass urine, or acute change in medical status  Brachytherapy for Prostate Cancer, Care After Refer to this sheet in the next few weeks. These instructions provide you with information on caring for yourself after your procedure. Your health care provider may also give you more specific instructions. Your treatment has been planned according to current medical practices, but problems sometimes occur. Call your health care provider if you have any problems or questions after your procedure. WHAT TO EXPECT AFTER THE PROCEDURE The area behind the scrotum will probably be tender and bruised. For a short period of time you may have:  Difficulty passing urine. You may need a catheter for a few days to a month.  Blood in the urine or semen.  A feeling of constipation because of prostate swelling.  Frequent feeling of an urgent need to urinate. For a long period of time you may have:  Inflammation of the rectum. This happens in about 2% of people who have the procedure.  Erection problems. These vary with age and occur in about 15-40% of men.  Difficulty urinating. This is caused by scarring in the urethra.  Diarrhea. HOME CARE INSTRUCTIONS   Take medicines only as directed by your health care provider.  You will probably have a catheter in your bladder for several days. You will have blood in the urine bag and should drink a lot of fluids to keep it a light red color.  Keep all follow-up visits as directed by your health care provider. If you have a catheter, it will be removed during one of these visits.  Try not to sit directly on the area behind the scrotum. A soft cushion can decrease the discomfort. Ice packs may also be helpful for the discomfort. Do not put ice  directly on the skin.  Shower and wash the area behind the scrotum gently. Do not sit in a tub.  If you have had the brachytherapy that uses the seeds, limit your close contact with children and pregnant women for 2 months because of the radiation still in the prostate. After that period of time, the levels drop off quickly. SEEK IMMEDIATE MEDICAL CARE IF:   You have a fever.  You have chills.  You have shortness of breath.  You have chest pain.  You have thick blood, like tomato juice, in the urine bag.  Your catheter is blocked so urine cannot get into the bag. Your bladder area or lower abdomen may be swollen.  There is excessive bleeding from your rectum. It is normal to have a little blood mixed with your stool.  There is severe discomfort in the treated area that does not go away with pain medicine.  You have abdominal discomfort.  You have severe nausea or vomiting.  You develop any new or unusual symptoms.   This information is not intended to replace advice given to you by your health care provider. Make sure you discuss any questions you have with your health care provider.   Document Released: 06/09/2010 Document Revised: 05/28/2014 Document Reviewed: 10/28/2012 Elsevier Interactive Patient Education 2016 [place] Instructions   Activity:    Rest for the remainder of the day.  Do  not drive or operate equipment today.  You may resume normal  activities in a few days as instructed by your physician, without risk of harmful radiation exposure to those around you, provided you follow the time and distance precautions on the Radiation Oncology Instruction Sheet.  Meals: Drink plenty of lipuids and eat light foods, such as gelatin or soup this evening .  You may return to normal meal plan tomorrow.  Return To Work: You may return to work as instructed by Naval architect.  Special Instruction:   If any seeds are found, use  tweezers to pick up seeds and place in a glass container of any kind and bring to your physician's office.  Call your physician if any of these symptoms occur:   Persistent or heavy bleeding  Urine stream diminishes or stops completely after catheter is removed  Fever equal to or greater than 101 degrees F  Cloudy urine with a strong foul odor  Severe pain  You may feel some burning pain and/or hesitancy when you urinate after the catheter is removed.  These symptoms may increase over the next few weeks, but should diminish within forur to six weeks.  Applying moist heat to the lower abdomen or a hot tub bath may help relieve the pain.  If the discomfort becomes severe, please call your physician for additional medications.  Remove perineal dressing tonight  Or in am.  Leave area open to air  Post Anesthesia Home Care Instructions  Activity: Get plenty of rest for the remainder of the day. A responsible adult should stay with you for 24 hours following the procedure.  For the next 24 hours, DO NOT: -Drive a car -Paediatric nurse -Drink alcoholic beverages -Take any medication unless instructed by your physician -Make any legal decisions or sign important papers.  Meals: Start with liquid foods such as gelatin or soup. Progress to regular foods as tolerated. Avoid greasy, spicy, heavy foods. If nausea and/or vomiting occur, drink only clear liquids until the nausea and/or vomiting subsides. Call your physician if vomiting continues.  Special Instructions/Symptoms: Your throat may feel dry or sore from the anesthesia or the breathing tube placed in your throat during surgery. If this causes discomfort, gargle with warm salt water. The discomfort should disappear within 24 hours.  If you had a scopolamine patch placed behind your ear for the management of post- operative nausea and/or vomiting:  1. The medication in the patch is effective for 72 hours, after which it should be  removed.  Wrap patch in a tissue and discard in the trash. Wash hands thoroughly with soap and water. 2. You may remove the patch earlier than 72 hours if you experience unpleasant side effects which may include dry mouth, dizziness or visual disturbances. 3. Avoid touching the patch. Wash your hands with soap and water after contact with the patch.

## 2016-01-26 NOTE — H&P (Signed)
William Lindsey is an 70 y.o. male.    Chief Complaint: Pre-op Brachytherapy Implantation  HPI:   1 - Moderate Risk Prostate Cancer - initial DX 2013 with Gleason 6 and placed on surveillance    Recent course:   09/2011 - PSA 5.69 --> Gleason 6 RLM (TRUS 31mL)   10/2012 - PSA 5.96 -> Gleason 6 RMA (TRUS 61mL)   12/2013 - PSA 6.91 --> Prostate MRI w/o large focal lesions   07/2014 - PSA 7.96 --> Atypia LLB (no carcinoma, TRUS 49mL); 03/2015 PSA 4.17 (on finasteride) --> plan to refrain from further BX / MRI unless sustained PSA rise   09/2015 PSA 5.75 (on finasteride) --> TRUS 49mL as part of radiation planning on finasteride (was orrig 68mL).    2 - Enlarged Prostate with Urinary Frequency - slowly progressive bother from mostly irritative voding sympotms. Placed on finasteride 07/2014.    PMH sig for left inguinal hernia repair, appy. His PCP is Jerilynn Birkenhead MD with [facility]. He is remarkably active and plays golf often, NO CV disease. NO blood thinners. His daughter, Candice Camp, is a Pension scheme manager at [employer] in breast / intracranial.    Today [place] is seen to proceed with prostate brachytherapy seed implantation.    Past Medical History:  Diagnosis Date  . Borderline hypertension    no meds  . BPH (benign prostatic hyperplasia)   . ED (erectile dysfunction)   . Hyperlipidemia   . Prostate cancer [facility]) dx 2013--  urologist-  dr Tresa Moore  oncologist-  dr Tammi Klippel   moderate risk T1c, Gleason 3+3,  PSA 5.75,  vol 66ml -- planned Radioactive seed implants 01-26-2016  . 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 Wears glasses     Past Surgical History:  Procedure Laterality Date  . APPENDECTOMY  teen  . COLONOSCOPY  last one 07/ 2017  . INGUINAL HERNIA REPAIR Left 1993  . KNEE ARTHROSCOPY Bilateral last one -- left 01/ 2017  . PROSTATE BIOPSY     multiple  . SHOULDER ARTHROSCOPY WITH OPEN ROTATOR CUFF REPAIR Left early  2000's  . TONSILLECTOMY  child    Family History  Problem Relation Age of Onset  . Lung cancer Father   . Breast cancer Sister    Social History:  reports that he has never smoked. He has never used smokeless tobacco. He reports that he does not drink alcohol or use drugs.  Allergies: No Known Allergies  No prescriptions prior to admission.    No results found for this or any previous visit (from the past 48 hour(s)). No results found.  Review of Systems  Constitutional: Negative.  Negative for chills and fever.  HENT: Negative.   Eyes: Negative.   Respiratory: Negative.   Cardiovascular: Negative.   Gastrointestinal: Negative.   Genitourinary: Negative.   Musculoskeletal: Negative.   Skin: Negative.   Neurological: Negative.   Endo/Heme/Allergies: Negative.     Height 5\' 9"  (1.753 m), weight 79.4 kg (175 lb). Physical Exam  Constitutional: He appears well-developed.  HENT:  Head: Normocephalic.  Eyes: Pupils are equal, round, and reactive to light.  Cardiovascular: Normal rate.   Respiratory: Effort normal.  GI: Soft.  Genitourinary:  Genitourinary Comments: No CVAT  Musculoskeletal: Normal range of motion.  Neurological: He is alert.  Skin: Skin is warm.  Psychiatric: He has a normal mood and affect. His behavior is normal. Judgment and thought content normal.     Assessment/Plan  Proceed with curative intent radiation therapy for clinically localized moderate risk prostate cancer. Discussed expected worsening of obstructive symptoms that will hopefully be transient and minor.  Risks, benefits, alternatives, expected peri-procedure course discussed extensively previously and reierated today.     Alexis Frock, MD 01/26/2016, 6:07 AM

## 2016-01-27 ENCOUNTER — Encounter (HOSPITAL_BASED_OUTPATIENT_CLINIC_OR_DEPARTMENT_OTHER): Payer: Self-pay | Admitting: Urology

## 2016-01-27 NOTE — Op Note (Signed)
NAMEMarland Kitchen  MAL, GRISCOM NO.:  [id number]  MEDICAL RECORD NO.:  [id number]  LOCATION:                                 FACILITY:  PHYSICIAN:  Alexis Frock, MD     DATE OF BIRTH:  08-15-1945  DATE OF PROCEDURE: 01/26/2016                              OPERATIVE REPORT   DIAGNOSIS:  Prostate cancer.  PROCEDURES: 1. Cystoscopy. 2. Brachytherapy, seed implantation.  ESTIMATED BLOOD LOSS:  Nil.  COMPLICATION:  None.  SPECIMEN:  None.  FINDINGS:  Unremarkable bladder urethra without any evidence of seeds in the urinary tract.  Prescribed dose 145 cGy.  Total number of seed is 78.  RADIATION ONCOLOGIST:  Lora Paula, M.D.  INDICATIONS:  [name] is a very pleasant and quite vigorous 70 year old gentleman with history of adenocarcinoma of the prostate, which has been initially on surveillance.  He has had PSA elevation and as listed to transition toward curative intent therapy with brachytherapy.  He underwent Radiation Oncology consultation with Dr. Tammi Klippel, who also corroborated he is a satisfactory candidate.  Informed consent was obtained and placed in the medical record.  PROCEDURE IN DETAIL:  The patient being William Lindsey, was verified. Procedure being brachytherapy, seed implantation and cystoscope was confirmed.  Procedure was carried out.  Time-out was performed. Intravenous antibiotics were administered.  General anesthesia was introduced.  The patient was placed into a high lithotomy position. Initial brachytherapy planning was done as per Radiation Oncology with the template in software as per their note.  Next, brachytherapy seeds were implanted according to template with 78 seeds total.  Taking great care to avoid seed implantation into rectum or bladder, which did not occur under ultrasound guidance.  Next, an in-situ Foley catheter was removed.  Cystourethroscopy was performed using a 16-French flexible cystoscope, which revealed  no evidence of radiation seeds within the bladder, prostate urethra.  Bladder was left marginally full.  Procedure was terminated. The patient tolerated the procedure well.  There were no immediate periprocedural complications.  The patient was taken to the postanesthesia care unit in stable condition.    ______________________________ Alexis Frock, MD   ______________________________ Alexis Frock, MD    TM/MEDQ  D:  01/26/2016  T:  01/27/2016  Job:  [id number]

## 2016-02-16 ENCOUNTER — Telehealth: Payer: Self-pay | Admitting: *Deleted

## 2016-02-16 NOTE — Telephone Encounter (Signed)
Called patient to remind of post seed appts., spoke with patient and he is aware of these appts.

## 2016-02-17 ENCOUNTER — Encounter: Payer: Self-pay | Admitting: Radiation Oncology

## 2016-02-17 ENCOUNTER — Ambulatory Visit
Admission: RE | Admit: 2016-02-17 | Discharge: 2016-02-17 | Disposition: A | Discharge status: Home / Self Care | Payer: Medicare Other | Source: Ambulatory Visit | Attending: Radiation Oncology | Admitting: Radiation Oncology

## 2016-02-17 VITALS — BP 161/107 | HR 84 | Resp 16 | Wt 177.7 lb

## 2016-02-17 DIAGNOSIS — R300 Dysuria: Secondary | ICD-10-CM | POA: Insufficient documentation

## 2016-02-17 DIAGNOSIS — C61 Malignant neoplasm of prostate: Secondary | ICD-10-CM | POA: Diagnosis not present

## 2016-02-17 DIAGNOSIS — R3911 Hesitancy of micturition: Secondary | ICD-10-CM | POA: Insufficient documentation

## 2016-02-17 DIAGNOSIS — Z510 Encounter for antineoplastic radiation therapy: Secondary | ICD-10-CM | POA: Insufficient documentation

## 2016-02-17 DIAGNOSIS — Z923 Personal history of irradiation: Secondary | ICD-10-CM | POA: Diagnosis not present

## 2016-02-17 MED ORDER — TAMSULOSIN HCL 0.4 MG PO CAPS: 0.4000 mg | ORAL_CAPSULE | Freq: Every day | ORAL | 5 refills | Status: DC

## 2016-02-17 NOTE — Progress Notes (Signed)
Radiation Oncology         [phone] ________________________________  Name: William Lindsey MRN: [id number]  Date: 02/17/2016  DOB: [birth year]  Follow-Up Visit Note  CC: Stephens Shire, MD  Alexis Frock, MD  Diagnosis: 70 y.o. gentleman with stage T1c adenocarcinoma of the prostate with a Gleason's score of 3+3 and a PSA of 7.96     ICD-9-CM ICD-10-CM   1. Prostate cancer (HCC) 185 C61 tamsulosin (FLOMAX) 0.4 MG CAPS capsule    Interval Since Last Radiation: 3 weeks  01/26/16: Insertion of radioactive I-125 seeds into the prostate gland. 145 Gy, definitive therapy.  Narrative:  The patient returns today for routine follow-up.  He is complaining of increased urinary frequency and urinary hesitation symptoms. He filled out a questionnaire regarding urinary function today providing and overall IPSS score of 31 characterizing his symptoms as severe. His symptoms include incomplete emptying, frequency, intermittency, urgency, weak stream, straining, nocturia, dysuria worse at night, and hematuria. He denies urinary leakage, incontinence or bowel complaints.  His pre-implant score was 7.  ALLERGIES:  has No Known Allergies.  Meds: Current Outpatient Prescriptions  Medication Sig Dispense Refill  . atorvastatin (LIPITOR) 40 MG tablet Take 40 mg by mouth.    . finasteride (PROSCAR) 5 MG tablet TK 1 T PO QD    . traMADol (ULTRAM) 50 MG tablet Take 1-2 tablets (50-100 mg total) by mouth every 6 (six) hours as needed for moderate pain (post-operatively). 30 tablet 0  . GAVILYTE-G 236 g solution MIX AND DRINK UTD  0  . tadalafil (CIALIS) 5 MG tablet Take 5 mg by mouth.     No current facility-administered medications for this encounter.     Physical Findings: The patient is in no acute distress. Patient is alert and oriented.  weight is 177 lb 11.2 oz (80.6 kg). His blood pressure is 161/107 (abnormal) and his pulse is 84. His respiration is 16 and oxygen saturation is 100%. .  No  significant changes.  Lab Findings: Lab Results  Component Value Date   WBC 6.3 01/19/2016   HGB 14.5 01/19/2016   HCT 42.4 01/19/2016   MCV 89.3 01/19/2016   PLT 250 01/19/2016    Radiographic Findings:  Patient underwent CT imaging in our clinic for post implant dosimetry. The CT appears to demonstrate an adequate distribution of radioactive seeds throughout the prostate gland. There no seeds in her near the rectum. I suspect the final radiation plan and dosimetry will show appropriate coverage of the prostate gland.   Impression: The patient is recovering from the effects of radiation. His urinary symptoms should gradually improve over the next 4-6 months. We talked about this today. He is encouraged by his improvement already and is otherwise please with his outcome.   Plan: Today, I spent time talking to the patient about his prostate seed implant and resolving urinary symptoms. We also talked about long-term follow-up for prostate cancer following seed implant. He understands that ongoing PSA determinations and digital rectal exams will help perform surveillance to rule out disease recurrence. He understands what to expect with his PSA measures. Patient was also educated today about some of the long-term effects from radiation including a small risk for rectal bleeding and possibly erectile dysfunction. We talked about some of the general management approaches to these potential complications. However, I did encourage the patient to contact our office or return at any point if he has questions or concerns related to his previous radiation and prostate cancer.  I will fill Flomax for the patient's urinary frequency. I advised the patient to purchase OTC AZO for his dysuria.  _____________________________________  Sheral Apley. Tammi Klippel, M.D.  This document serves as a record of services personally performed by Tyler Pita, MD. It was created on his behalf by Darcus Austin, a trained  medical scribe. The creation of this record is based on the scribe's personal observations and the provider's statements to them. This document has been checked and approved by the attending provider.

## 2016-02-17 NOTE — Progress Notes (Signed)
  Radiation Oncology         [phone] ________________________________  Name: William Lindsey MRN: [id number]  Date: 02/17/2016  DOB: 08-Sep-1945  COMPLEX SIMULATION NOTE  NARRATIVE:  The patient was brought to the [facility] today following prostate seed implantation approximately one month ago.  Identity was confirmed.  All relevant records and images related to the planned course of therapy were reviewed.  Then, the patient was set-up supine.  CT images were obtained.  The CT images were loaded into the planning software.  Then the prostate and rectum were contoured.  Treatment planning then occurred.  The implanted iodine 125 seeds were identified by the physics staff for projection of radiation distribution  I have requested : 3D Simulation  I have requested a DVH of the following structures: Prostate and rectum.    ________________________________  Sheral Apley Tammi Klippel, M.D.  This document serves as a record of services personally performed by Tyler Pita, MD. It was created on his behalf by Darcus Austin, a trained medical scribe. The creation of this record is based on the scribe's personal observations and the provider's statements to them. This document has been checked and approved by the attending provider.

## 2016-02-17 NOTE — Progress Notes (Signed)
BP elevated. Weight stable. Pre seed IPSS 7. Post seed IPSS 31 with incomplete emptying, frequency, intermittency, urgency , weak stream, straining and nocturia. Reports dysuria worse at night. Denies hematuria. Denies urinary leakage or incontinence. Denies any bowel complaints.   BP (!) 161/107 (BP Location: Left Arm, Patient Position: Sitting, Cuff Size: Normal)   Pulse 84   Resp 16   Wt 177 lb 11.2 oz (80.6 kg)   SpO2 100%   BMI 26.24 kg/m  Wt Readings from Last 3 Encounters:  02/17/16 177 lb 11.2 oz (80.6 kg)  01/26/16 177 lb (80.3 kg)  10/20/15 180 lb (81.6 kg)

## 2016-03-01 DIAGNOSIS — C61 Malignant neoplasm of prostate: Secondary | ICD-10-CM | POA: Diagnosis present

## 2016-03-01 DIAGNOSIS — Z510 Encounter for antineoplastic radiation therapy: Secondary | ICD-10-CM | POA: Diagnosis not present

## 2016-03-04 NOTE — Progress Notes (Signed)
  Radiation Oncology         [phone] ________________________________  Name: EGON DIVELBISS MRN: [id number]  Date: 02/17/2016  DOB: 1945-09-29  3D Planning Note   Prostate Brachytherapy Post-Implant Dosimetry  Diagnosis: 70 y.o. gentleman with stage T1c adenocarcinoma of the prostate with a Gleason's score of 3+3 and a PSA of 7.96  Narrative: On a previous date, EULA HOEY returned following prostate seed implantation for post implant planning. He underwent CT scan complex simulation to delineate the three-dimensional structures of the pelvis and demonstrate the radiation distribution.  Since that time, the seed localization, and complex isodose planning with dose volume histograms have now been completed.  Results:   Prostate Coverage - The dose of radiation delivered to the 90% or more of the prostate gland (D90) was 99.14% of the prescription dose. This exceeds our goal of greater than 90%. Rectal Sparing - The volume of rectal tissue receiving the prescription dose or higher was 0.0 cc. This falls under our thresholds tolerance of 1.0 cc.  Impression: The prostate seed implant appears to show adequate target coverage and appropriate rectal sparing.  Plan:  The patient will continue to follow with urology for ongoing PSA determinations. I would anticipate a high likelihood for local tumor control with minimal risk for rectal morbidity.  ________________________________  Sheral Apley Tammi Klippel, M.D.

## 2016-04-05 DIAGNOSIS — I10 Essential (primary) hypertension: Secondary | ICD-10-CM | POA: Insufficient documentation

## 2016-04-05 HISTORY — DX: Essential (primary) hypertension: I10

## 2016-12-25 IMAGING — DX DG CHEST 2V
2 series · 2 of 2 positions shown · non-contrast
Comparison: 02/23/2009.

CLINICAL DATA: History of prostate cancer. Preoperative chest
x-ray.

EXAM:
CHEST  2 VIEW

[chest pa]
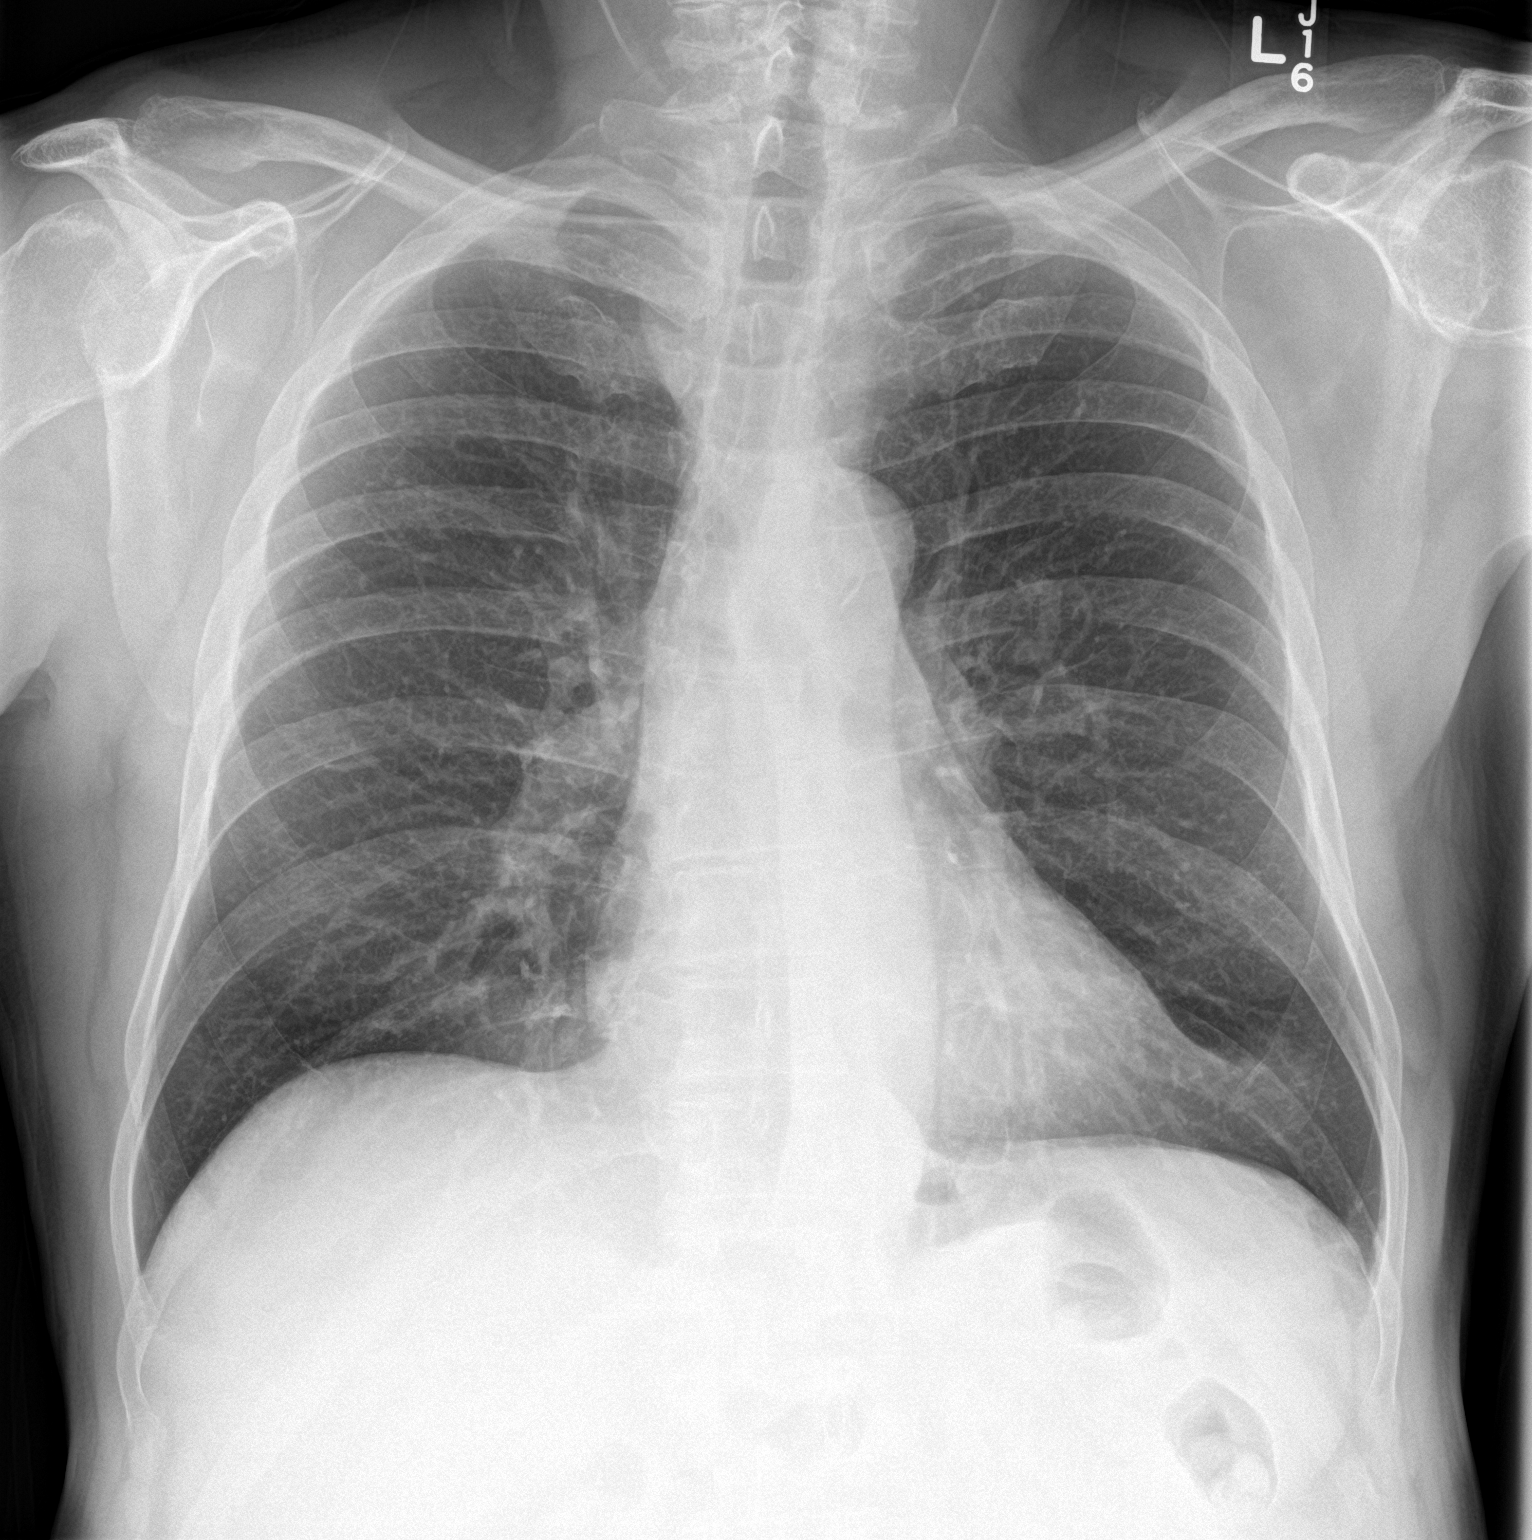

[chest lat]
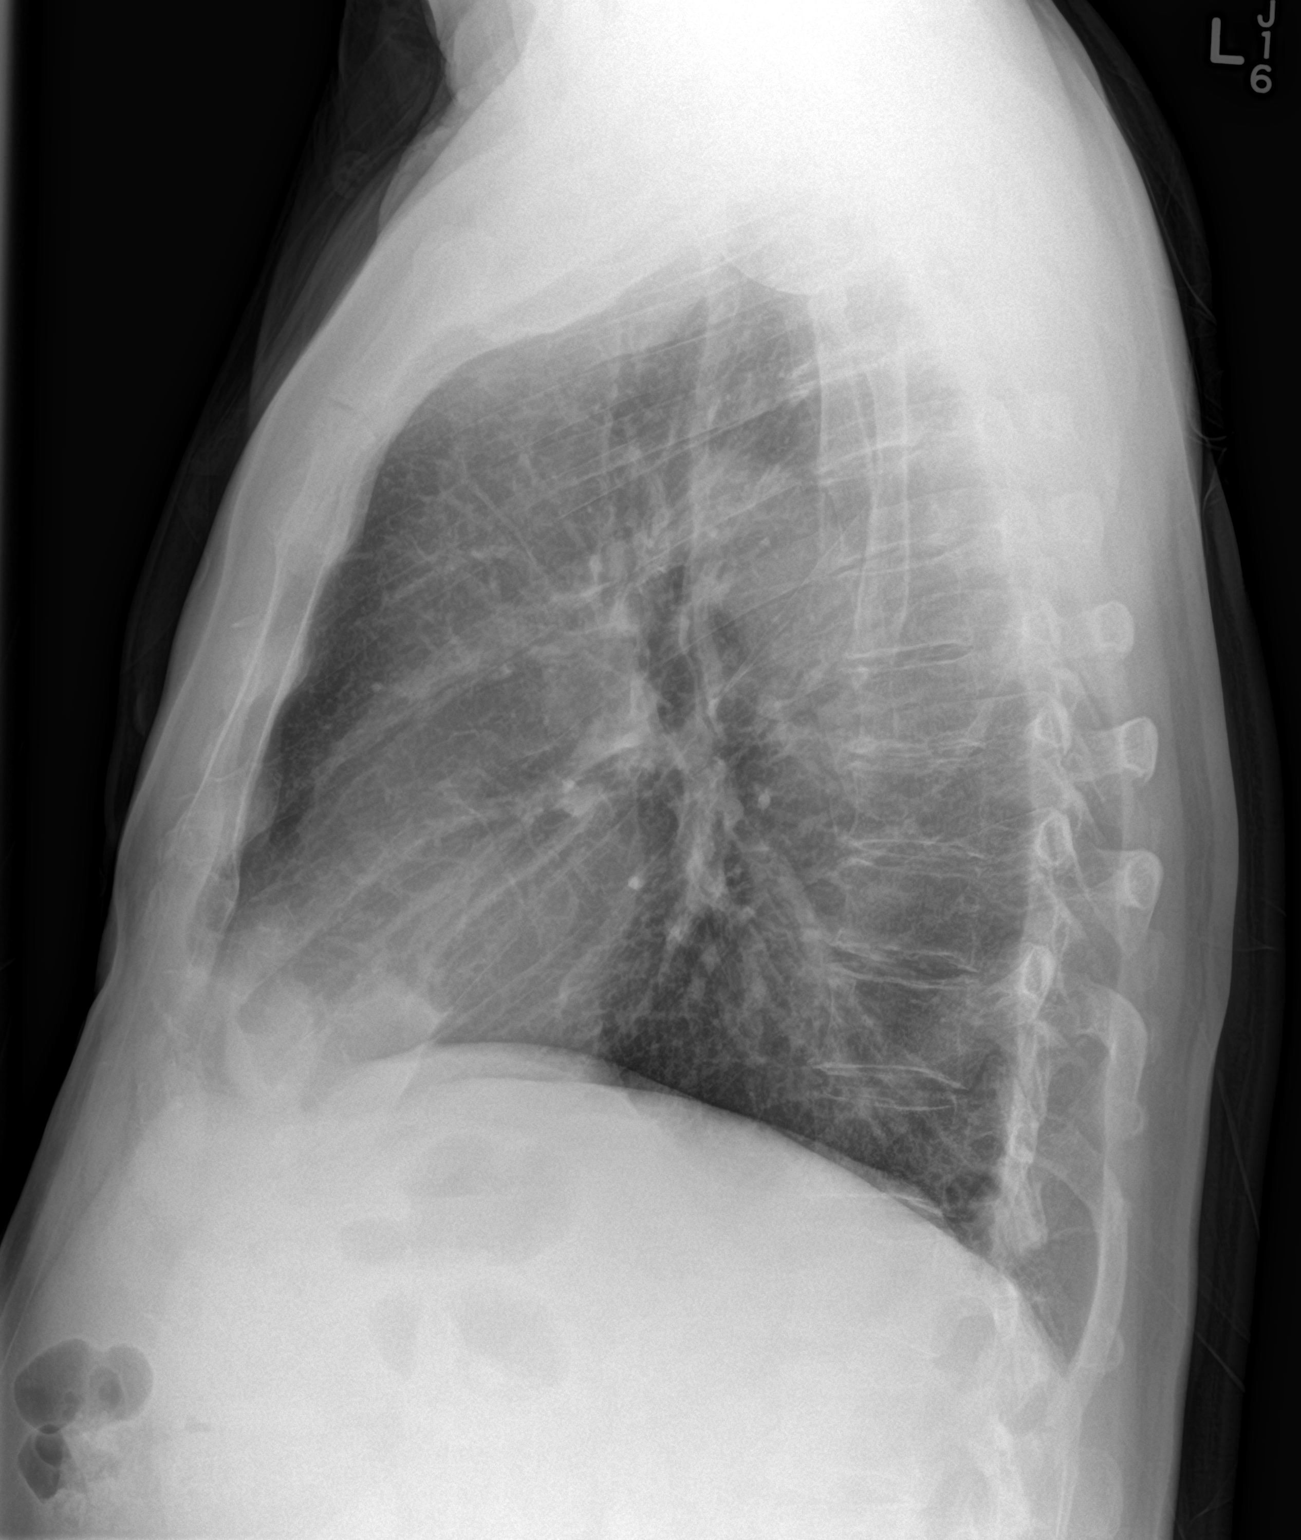

[2 of 2 positions shown; findings below may reference images not displayed]

FINDINGS: Mediastinum hilar structures normal. Lungs are clear. Prominent
epicardial fat pads. Heart size normal. No pleural effusion or
pneumothorax.
IMPRESSION: Prominent epicardial fat pads.  No acute cardiopulmonary disease .

## 2018-07-02 ENCOUNTER — Telehealth: Payer: Self-pay | Admitting: *Deleted

## 2018-07-02 NOTE — Telephone Encounter (Signed)
CALLED PATIENT TO ALTER FU APPT. DUE TO BEING SCHEDULED WITH WRONG PROVIDER, RESCHEDULED FOR 07-10-18 @ 2:30 PM WITH ASHLYN BRUNING, LVM FOR A RETURN CALL

## 2018-07-07 ENCOUNTER — Ambulatory Visit: Payer: Self-pay | Admitting: Radiation Oncology

## 2018-07-10 ENCOUNTER — Ambulatory Visit: Payer: Medicare Other | Admitting: Urology

## 2018-07-23 ENCOUNTER — Other Ambulatory Visit: Payer: Self-pay

## 2018-07-23 ENCOUNTER — Encounter: Payer: Self-pay | Admitting: Urology

## 2018-07-23 ENCOUNTER — Ambulatory Visit
Admission: RE | Admit: 2018-07-23 | Discharge: 2018-07-23 | Disposition: A | Discharge status: Home / Self Care | Payer: Medicare Other | Source: Ambulatory Visit | Attending: Urology | Admitting: Urology

## 2018-07-23 VITALS — BP 151/93 | HR 96 | Temp 98.1°F | Resp 17 | Wt 180.6 lb

## 2018-07-23 DIAGNOSIS — C61 Malignant neoplasm of prostate: Secondary | ICD-10-CM | POA: Diagnosis present

## 2018-07-23 DIAGNOSIS — Z79899 Other long term (current) drug therapy: Secondary | ICD-10-CM | POA: Diagnosis not present

## 2018-07-23 DIAGNOSIS — Z923 Personal history of irradiation: Secondary | ICD-10-CM | POA: Diagnosis not present

## 2018-07-23 NOTE — Progress Notes (Signed)
Radiation Oncology         [phone] ________________________________  Name: William Lindsey MRN: [id number]  Date: 07/23/2018  DOB: 08-Jul-1945  Post Treatment Note  CC: William Sacramento, MD  William Frock, MD  Diagnosis:   73 y.o. gentleman with stage T1c adenocarcinoma of the prostate with a Gleason's score of 3+3 and a PSA of 7.96   Interval Since Last Radiation:  2.5 years  01/26/16: Insertion of radioactive I-125 seeds into the prostate gland. 145Gy, definitive therapy.  Narrative:  The patient returns today for routine follow-up.  He is currently without complaints and reports that he has continued in close follow-up with Dr. Tresa Moore since the time of his brachytherapy procedure.   At his most recent visit on 10/29/2017, his PSA reached its nadir at 0.04 and he was released to follow-up annually at that time.  He was also able to discontinue his finasteride at that time and has not had any issues with LUTS since stopping the medication.  He remains quite pleased with his progress since treatment.                        On review of systems, the patient states that he is doing very well overall.  He specifically denies dysuria, gross hematuria, increased frequency, urgency, weak stream, straining to void, incomplete bladder emptying or incontinence.  He reports a healthy appetite and is maintaining his weight.  He denies abdominal pain, nausea, vomiting, diarrhea or constipation.  ALLERGIES:  has No Known Allergies.  Meds: Current Outpatient Medications  Medication Sig Dispense Refill  . atorvastatin (LIPITOR) 40 MG tablet Take 40 mg by mouth.    . finasteride (PROSCAR) 5 MG tablet TK 1 T PO QD    . GAVILYTE-G 236 g solution MIX AND DRINK UTD  0  . losartan (COZAAR) 50 MG tablet Take 1 tablet daily for blood pressure control.    . tadalafil (CIALIS) 5 MG tablet Take 5 mg by mouth.    . traMADol (ULTRAM) 50 MG tablet Take 1-2 tablets (50-100 mg total) by mouth every 6 (six) hours as  needed for moderate pain (post-operatively). 30 tablet 0   No current facility-administered medications for this encounter.     Physical Findings:  weight is 180 lb 9.6 oz (81.9 kg). His oral temperature is 98.1 F (36.7 C). His blood pressure is 151/93 (abnormal) and his pulse is 96. His respiration is 17 and oxygen saturation is 96%.  Pain Assessment Pain Score: 0-No pain/10 In general this is a well appearing Caucasian male in no acute distress.  He's alert and oriented x4 and appropriate throughout the examination. Cardiopulmonary assessment is negative for acute distress and he exhibits normal effort.   Lab Findings: Lab Results  Component Value Date   WBC 6.3 01/19/2016   HGB 14.5 01/19/2016   HCT 42.4 01/19/2016   MCV 89.3 01/19/2016   PLT 250 01/19/2016     Radiographic Findings: No results found.  Impression/Plan: 1. 73 y.o. gentleman with stage T1c adenocarcinoma of the prostate with a Gleason's score of 3+3 and a PSA of 7.96. He has had an excellent response to his previous brachii therapy with a PSA nadir at 0.04 on 10/29/2017.  He continues in routine follow-up with Dr. Tresa Moore on an annual basis at this point.  We discussed that while we are happy to continue to participate in his care if clinically indicated, at this point, we will plan  to see him back on an as-needed basis.  We will look forward to continuing to follow his progress via correspondence with Dr. Tresa Moore.  He is in agreement with the stated plan and knows to call at anytime with any questions or concerns related to his previous radiotherapy.     William Johns, PA-C

## 2019-05-19 DIAGNOSIS — M6208 Separation of muscle (nontraumatic), other site: Secondary | ICD-10-CM | POA: Insufficient documentation

## 2019-05-19 HISTORY — DX: Separation of muscle (nontraumatic), other site: M62.08

## 2019-06-11 ENCOUNTER — Ambulatory Visit: Payer: Medicare Other | Attending: Internal Medicine

## 2019-06-11 DIAGNOSIS — Z23 Encounter for immunization: Secondary | ICD-10-CM | POA: Insufficient documentation

## 2019-06-11 NOTE — Progress Notes (Signed)
   Covid-19 Vaccination Clinic  Name:  YOLANDA PADDACK    MRN: [id number] DOB: 15-Aug-1945  06/11/2019  Mr. Mesic was observed post Covid-19 immunization for 15 minutes without incidence. He was provided with Vaccine Information Sheet and instruction to access the V-Safe system.   Mr. Austin was instructed to call 911 with any severe reactions post vaccine: Marland Kitchen Difficulty breathing  . Swelling of your face and throat  . A fast heartbeat  . A bad rash all over your body  . Dizziness and weakness    Immunizations Administered    Name Date Dose VIS Date Route   Pfizer COVID-19 Vaccine 06/11/2019  8:33 AM 0.3 mL 05/01/2019 Intramuscular   Manufacturer: [place]   Lot: [id number]   [place]: [id number]

## 2019-07-01 ENCOUNTER — Ambulatory Visit: Payer: Medicare Other | Attending: Internal Medicine

## 2019-07-01 DIAGNOSIS — Z23 Encounter for immunization: Secondary | ICD-10-CM | POA: Insufficient documentation

## 2019-07-01 NOTE — Progress Notes (Signed)
   Covid-19 Vaccination Clinic  Name:  William Lindsey    MRN: [id number] DOB: [birth year]  07/01/2019  [name] was observed post Covid-19 immunization for 15 minutes without incidence. He was provided with Vaccine Information Sheet and instruction to access the V-Safe system.   [name] was instructed to call 911 with any severe reactions post vaccine: Marland Kitchen Difficulty breathing  . Swelling of your face and throat  . A fast heartbeat  . A bad rash all over your body  . Dizziness and weakness    Immunizations Administered    Name Date Dose VIS Date Route   Pfizer COVID-19 Vaccine 07/01/2019 12:16 PM 0.3 mL 05/01/2019 Intramuscular   Manufacturer: [place]   Lot: [id number]   [place]: [id number]

## 2020-03-06 DIAGNOSIS — N1831 Chronic kidney disease, stage 3a: Secondary | ICD-10-CM | POA: Insufficient documentation

## 2020-03-06 HISTORY — DX: Chronic kidney disease, stage 3a: N18.31

## 2021-03-01 DIAGNOSIS — R251 Tremor, unspecified: Secondary | ICD-10-CM | POA: Insufficient documentation

## 2021-03-01 HISTORY — DX: Tremor, unspecified: R25.1

## 2022-04-10 ENCOUNTER — Encounter: Payer: Self-pay | Admitting: *Deleted

## 2022-04-11 ENCOUNTER — Encounter: Payer: Self-pay | Admitting: Neurology

## 2022-04-11 ENCOUNTER — Ambulatory Visit: Payer: Medicare Other | Admitting: Neurology

## 2022-04-11 VITALS — BP 148/94 | HR 81 | Ht 69.0 in | Wt 178.2 lb

## 2022-04-11 DIAGNOSIS — G20C Parkinsonism, unspecified: Secondary | ICD-10-CM | POA: Diagnosis not present

## 2022-04-11 DIAGNOSIS — G250 Essential tremor: Secondary | ICD-10-CM | POA: Diagnosis not present

## 2022-04-11 NOTE — Progress Notes (Signed)
Subjective:    Patient ID: William Lindsey is a 76 y.o. male.  HPI    Star Age, MD, PhD [facility] [address] [place], [place] [address]  Dear Dr. Redmond Pulling,  I saw your patient, William Lindsey, your kind request and my neurologic clinic today for initial consultation of his tremor.  The patient is accompanied by his wife today.  As you know, William Lindsey is a 76 year old male with an underlying medical history of prostate cancer, hyperlipidemia, hypertension, and mildly overweight state, who reports a longstanding history of bilateral upper extremity tremors, particularly affecting the right side with slow progression over time.  He has noticed significant trembling on the right side in the past year.  He has not fallen.  Tremor has not been bothersome until this past year.  He has trouble holding something, often switches to the left hand for better control.  He is right-handed.  He is retired, he was in retail, has no family history of tremors.  Father died at 56 with cancer, mom lived to be 42, she had dementia.  He has 1 sister who is 32 has no tremors.  He has 4 grown children, none with tremors.  He has not noticed any tremor in the upper body, such as neck or voice.  He has 8 grandchildren and 3 great-grandchildren.  He drinks alcohol rarely in the form of wine, he drinks caffeine in the form of coffee, half a cup per day and 2 glasses of tea.  He has been a non-smoker.  I reviewed your office note from 03/06/2022.  He had blood work through your office on 03/02/2022 and I reviewed the results: CBC with differential showed hemoglobin mildly below normal at 13.5, hematocrit below normal at 39.3, otherwise benign findings, CMP showed benign findings with sodium 139, potassium 4.1, BUN 20, creatinine borderline at 1.46.  Lipid profile with benign findings.  I do not see a recent TSH in his chart.  He reports that his tremor has been ongoing for about 20  years even.  He has never had any medication for this.  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    Wears glasses     His Past Surgical History Is Significant For: Past Surgical History:  Procedure Laterality Date   APPENDECTOMY  teen   COLONOSCOPY  last one 07/ 2017   INGUINAL HERNIA REPAIR Left 1993   KNEE ARTHROSCOPY Bilateral last one -- left 01/ 2017   PROSTATE BIOPSY     multiple   RADIOACTIVE SEED IMPLANT N/A 01/26/2016   Procedure: RADIOACTIVE SEED IMPLANT/BRACHYTHERAPY IMPLANT;  Surgeon: TAlexis Frock MD;  [facility]  Service: Urology;  Laterality: N/A;   SHOULDER ARTHROSCOPY WITH OPEN ROTATOR CUFF REPAIR Left early 2000's   TONSILLECTOMY  child    His Family History Is Significant For: Family History  Problem Relation Age of Onset   Alzheimer's disease Mother    Lung cancer Father    Breast cancer Sister  Parkinson's disease Neg Hx    Tremor Neg Hx     His Social History Is Significant For: Social History   Socioeconomic History   Marital status: Married    Spouse name: Diane   Number of children: 4   Years of education: Not on file   Highest education level: Bachelor's degree (e.g., BA, AB, BS)  Occupational History   Not on file  Tobacco Use   Smoking status: Never   Smokeless tobacco: Never  Vaping Use   Vaping Use: Never used  Substance and Sexual Activity   Alcohol use: No   Drug use: No   Sexual activity: Yes  Other Topics Concern   Not on file  Social History Narrative   Lives with Wife   Right  Handed   No children Living with him   1/2 cup of Coffee a Day   Social Determinants of Health   Financial Resource Strain: Not on file  Food Insecurity: Not on file  Transportation Needs: No Transportation Needs (07/23/2018)   PRAPARE - Hydrologist (Medical): No    Lack of Transportation (Non-Medical): No  Physical Activity: Not on file  Stress: Not on file  Social Connections: Not on file    His Allergies Are:  Allergies  Allergen Reactions   Lisinopril Other (See Comments)    Or tickle in throat  :   His Current Medications Are:  Outpatient Encounter Medications as of 04/11/2022  Medication Sig   atorvastatin (LIPITOR) 20 MG tablet Take 20 mg by mouth daily.   losartan (COZAAR) 50 MG tablet Take 1 tablet daily for blood pressure control.   No facility-administered encounter medications on file as of 04/11/2022.  :   Review of Systems:  Out of a complete 14 point review of systems, all are reviewed and negative with the exception of these symptoms as listed below:   Review of Systems  Neurological:        Pt is here with his Wife. Pt states he is here for tremors. Pt states there hasn't been any falls in the last week or month. Pt sates that tremors are getting worse. Pt states tremors started 20 years ago. Pt states that tremors are in both hands. Pt states that tremors happen all the time. Pt states tremors are worse in Right Hand.    Objective:  Neurological Exam  Physical Exam Physical Examination:   Vitals:   04/11/22 0808  BP: (!) 148/94  Pulse: 81    General Examination: The patient is a very pleasant 76 y.o. male in no acute distress. He appears well-developed and well-nourished and well groomed.   HEENT: Normocephalic, atraumatic, pupils are equal, round and reactive to light, extraocular tracking is good without limitation to gaze excursion or nystagmus noted. Hearing is grossly intact. Face is symmetric with normal  facial animation. Speech is clear with no dysarthria noted. There is no hypophonia. There is no lip, neck/head, jaw or voice tremor. Neck is supple with full range of passive and active motion, questionable mild nuchal rigidity. There are no carotid bruits on auscultation. Oropharynx exam reveals: Benign findings, no abnormal tongue movements.  No facial dyskinesias.   Chest: Clear to auscultation without wheezing, rhonchi or crackles noted.  Heart: S1+S2+0, regular and normal without murmurs, rubs or gallops noted.   Abdomen: Soft, non-tender and non-distended.  Extremities: There is no pitting edema in the distal lower extremities bilaterally.   Skin: Warm and dry without  trophic changes noted.   Musculoskeletal: exam reveals no obvious joint deformities.   Neurologically:  Mental status: The patient is awake, alert and oriented in all 4 spheres. His immediate and remote memory, attention, language skills and fund of knowledge are appropriate. There is no evidence of aphasia, agnosia, apraxia or anomia. Speech is clear with normal prosody and enunciation. Thought process is linear. Mood is normal and affect is normal.  Cranial nerves II - XII are as described above under HEENT exam.  Motor exam: Normal bulk, strength and tone is noted, with the exception of slight increase in tone in the right upper extremity, no telltale cogwheeling.  He has a resting tremor in the right upper extremity, both upper extremities have a postural tremor, slightly worse on the right, mild action tremor.    On 04/11/2022: On Archimedes spiral drawing he has a mild to moderate trembling with both hands, handwriting is legible, he can write in cursive, very slightly tremulous, not particularly micrographic.   Fine motor skills and coordination: He has mild impairment with finger taps and hand movements and rapid alternating patting, no lateralization, no significant impairment of foot taps bilaterally, no  lateralization.     Cerebellar testing: No dysmetria or intention tremor. There is no truncal or gait ataxia.  Normal finger-to-nose, normal heel-to-shin.  Sensory exam: intact to light touch in the upper and lower extremities.  Gait, station and balance: He stands without difficulty and posture is age-appropriate.  He walks with good pace and good stride length but slight decrease in arm swing on the right.  He turns without difficulty, balance is preserved.  Assessment and Plan:   In summary, KAI CALICO is a very pleasant 76 y.o.-year old male with an underlying medical history of prostate cancer, hyperlipidemia, hypertension, and mildly overweight state, who presents for evaluation of his tremor disorder of several years duration.  He has had a hand tremor of approximately 20 years as per his recollection, slowly progressive but more noticeable in the past year and worse on the right side particularly in the past year.  He has a history and examination favoring essential tremor, what with a longstanding history.  Nevertheless, exam findings are not clear, for tremor condition versus another.  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  In particular, he has resting tremor in the right upper extremity, slight increase in tone in the right upper extremity and mild decrease in arm swing on the right.  Not quite committed to a diagnosis of idiopathic Parkinson's disease with him.  He may have an overlap picture.  For now, we talked a little bit about symptomatic treatment options for these conditions but I would like to see if we can pursue a DaTscan at this time.  I explained the scan to the patient and his wife.  We talked about tremor triggers and supportive treatments.  He is advised to stay well-hydrated, continue to limit his caffeine, we will go ahead and check some blood work for kidney function in preparation for the DaTscan and also a TSH at this time.  We will  reconvene after testing, but we will keep him posted as to the test results in the interim by phone call as well.  I answered all their questions today and the patient and his wife are in agreement.  Thank you very much for allowing me to participate in the care of this nice patient. If I can be of any further assistance  to you please do not hesitate to call me at [phone].  Sincerely,   Star Age, MD, PhD

## 2022-04-11 NOTE — Patient Instructions (Signed)
You have a tremor of both hands, more so on the right side with some features favoring the diagnosis of essential tremor, but also some findings that can be see with parkinsonism.   For your tremor, we can consider medication in the near future.   Please remember, that any kind of tremor may be exacerbated by anxiety, anger, nervousness, excitement, dehydration, sleep deprivation, thyroid dysfunction, by caffeine, and low blood sugar values or blood sugar fluctuations. Some medications can exacerbate tremors, this includes certain asthma or COPD medications and certain antidepressants.   I would like to proceed with a so called DaT scan: This is a specialized brain scan designed to help with diagnosis of tremor disorders. A radioactive marker gets injected and the uptake is measured in the brain and compared to normal controls and right side is compared to the left, a change in uptake can help with diagnosis of certain tremor disorders. A brain MRI on the other hand is a brain scan that helps look at the brain structure in more detail overall and look for age-related changes, blood vessel related changes and look for stroke and volume loss which we call atrophy.   We will do blood work today and call you with the results.

## 2022-04-12 LAB — COMPREHENSIVE METABOLIC PANEL
ALT: 20 IU/L (ref 0–44)
AST: 17 IU/L (ref 0–40)
Albumin/Globulin Ratio: 1 — ABNORMAL LOW (ref 1.2–2.2)
Albumin: 3.9 g/dL (ref 3.8–4.8)
Alkaline Phosphatase: 64 IU/L (ref 44–121)
BUN/Creatinine Ratio: 11 (ref 10–24)
BUN: 15 mg/dL (ref 8–27)
Bilirubin Total: 0.5 mg/dL (ref 0.0–1.2)
CO2: 25 mmol/L (ref 20–29)
Calcium: 8.9 mg/dL (ref 8.6–10.2)
Chloride: 102 mmol/L (ref 96–106)
Creatinine, Ser: 1.4 mg/dL — ABNORMAL HIGH (ref 0.76–1.27)
Globulin, Total: 3.9 g/dL (ref 1.5–4.5)
Glucose: 103 mg/dL — ABNORMAL HIGH (ref 70–99)
Potassium: 4.4 mmol/L (ref 3.5–5.2)
Sodium: 138 mmol/L (ref 134–144)
Total Protein: 7.8 g/dL (ref 6.0–8.5)
eGFR: 52 mL/min/{1.73_m2} — ABNORMAL LOW (ref >59)

## 2022-04-12 LAB — TSH: TSH: 1.65 u[IU]/mL (ref 0.450–4.500)

## 2022-04-13 ENCOUNTER — Telehealth: Payer: Self-pay | Admitting: Neurology

## 2022-04-13 NOTE — Telephone Encounter (Signed)
[employer] medicare Josem Kaufmann: [id number] exp. 04/13/22-05/12/22 sent to [facility]

## 2022-04-16 ENCOUNTER — Telehealth: Payer: Self-pay | Admitting: Neurology

## 2022-04-16 DIAGNOSIS — G250 Essential tremor: Secondary | ICD-10-CM

## 2022-04-16 MED ORDER — PROPRANOLOL HCL 20 MG PO TABS: 20.0000 mg | ORAL_TABLET | Freq: Three times a day (TID) | ORAL | 3 refills | Status: DC

## 2022-04-16 NOTE — Telephone Encounter (Signed)
Called pt and LVM asking for call back.

## 2022-04-16 NOTE — Telephone Encounter (Signed)
Please call patient and advise him that his creatinine level was 1.4, TCM phone 1.2 for [place] to safely proceed with a DaTscan.  For now, I recommend we start a trial of propranolol, we discussed symptomatic treatment of his tremor during the office visit.  Thyroid screening test called TSH was normal.   Please advise patient that I sent a prescription for Inderal (generic name: propranolol) 20 mg strength: take 1 pill each bedtime for 1 week, then 1 pill twice daily for 2 weeks, then 1 pill 3 times a day thereafter.   Please go over side effects: Common side effects reported are: lethargy, sedation, low blood pressure and low pulse rate, depression or worsening depression, worsening asthma. Please monitor BP and Pulse every few days, if pulse drops lower than 55 he may feel bad, such as lightheadedness or lethargy, and we may have to adjust the dose. Same with BP below 110/55.

## 2022-04-16 NOTE — Telephone Encounter (Signed)
Pt called back. Requesting a call back from the nurse.  

## 2022-04-17 NOTE — Telephone Encounter (Signed)
I called pt and relayed the lab results. Creatnine 1.4 , will cancel the datscan for now and proceed with propranolol '20mg'$  po qhs for 1 wk, increase to '20mg'$  po BID for 2 wks, then '20mg'$  po TID.  Monitor BP and HR every 2 days.  (SE given to pt as well as BP and HR parameters).  Pt verbalized understanding.  I answered questions and he will call back if concerns or questions.  He does have Bp monitor at home.  I cancelled the [place] in EPIC.

## 2022-04-17 NOTE — Telephone Encounter (Signed)
Pt called back. Requesting a call back from nurse.  

## 2022-04-17 NOTE — Addendum Note (Signed)
Addended by: Brandon Melnick on: 04/17/2022 02:13 PM   Modules accepted: Orders

## 2022-06-13 DIAGNOSIS — K080 Exfoliation of teeth due to systemic causes: Secondary | ICD-10-CM | POA: Diagnosis not present

## 2022-07-24 DIAGNOSIS — L578 Other skin changes due to chronic exposure to nonionizing radiation: Secondary | ICD-10-CM | POA: Diagnosis not present

## 2022-07-24 DIAGNOSIS — D225 Melanocytic nevi of trunk: Secondary | ICD-10-CM | POA: Diagnosis not present

## 2022-07-24 DIAGNOSIS — L821 Other seborrheic keratosis: Secondary | ICD-10-CM | POA: Diagnosis not present

## 2022-07-24 DIAGNOSIS — L814 Other melanin hyperpigmentation: Secondary | ICD-10-CM | POA: Diagnosis not present

## 2022-09-05 ENCOUNTER — Other Ambulatory Visit: Payer: Self-pay | Admitting: Neurology

## 2022-09-06 NOTE — Telephone Encounter (Signed)
Pt called wanting to know when this medication will be called in for him. Please advise.  

## 2022-09-10 NOTE — Telephone Encounter (Signed)
Pt called wanting to know when this medication will be called in for him. Please advise.   Pt stated he is out of medication and needs refill sent today.

## 2022-12-06 DIAGNOSIS — C61 Malignant neoplasm of prostate: Secondary | ICD-10-CM | POA: Diagnosis not present

## 2022-12-06 DIAGNOSIS — N5201 Erectile dysfunction due to arterial insufficiency: Secondary | ICD-10-CM | POA: Diagnosis not present

## 2022-12-06 DIAGNOSIS — R350 Frequency of micturition: Secondary | ICD-10-CM | POA: Diagnosis not present

## 2022-12-18 ENCOUNTER — Other Ambulatory Visit: Payer: Self-pay | Admitting: Neurology

## 2023-02-05 ENCOUNTER — Telehealth: Payer: Self-pay | Admitting: Neurology

## 2023-02-05 NOTE — Telephone Encounter (Signed)
This encounter has been routed to pt's PCP, Dr Benedetto Goad.

## 2023-02-05 NOTE — Telephone Encounter (Signed)
Reviewed phone note. No further recommendations, thank you.  Recommend check with PCP. Hx of impaired kidney function, no recent labs in our system.

## 2023-02-05 NOTE — Telephone Encounter (Signed)
Pt's daughter, Dana Bestor (not on DPR)  Pt having rapid change of behavior and spending large amounts of money. Would like a call back to discuss what to do. Can call my mother, Jaiyon Marchewka (on [place])

## 2023-02-05 NOTE — Telephone Encounter (Signed)
I spoke with the patient's wife William Lindsey (on [place]) and discussed.  She was with the patient so she was not able to say much but she did confirm that the daughter's concerns of change in behavior and spending large amounts of money are true.  His shaking is also worse. She confirmed there was no concern at all about patient's safety or the safety of others but I did advise that if this becomes a concern to call 911 and she said okay.  She said she would have primary care work him up first to ensure there is no laboratory abnormalities such as UTI, etc. before he sees Dr. Frances Furbish.  I did advise that since this is a new problem and we only see him for tremor, Dr. Frances Furbish would require a new referral from primary care if he is needing to see neurology for this problem.    I checked and I do not see anything sooner with Dr. Frances Furbish than his appointment on 03/18/2023. She was very appreciative for the call.

## 2023-02-20 DIAGNOSIS — N5201 Erectile dysfunction due to arterial insufficiency: Secondary | ICD-10-CM | POA: Diagnosis not present

## 2023-03-06 DIAGNOSIS — I10 Essential (primary) hypertension: Secondary | ICD-10-CM | POA: Diagnosis not present

## 2023-03-12 DIAGNOSIS — Z0000 Encounter for general adult medical examination without abnormal findings: Secondary | ICD-10-CM | POA: Diagnosis not present

## 2023-03-12 DIAGNOSIS — E785 Hyperlipidemia, unspecified: Secondary | ICD-10-CM | POA: Diagnosis not present

## 2023-03-12 DIAGNOSIS — Z0001 Encounter for general adult medical examination with abnormal findings: Secondary | ICD-10-CM | POA: Diagnosis not present

## 2023-03-12 DIAGNOSIS — N1831 Chronic kidney disease, stage 3a: Secondary | ICD-10-CM | POA: Diagnosis not present

## 2023-03-12 DIAGNOSIS — Z23 Encounter for immunization: Secondary | ICD-10-CM | POA: Diagnosis not present

## 2023-03-12 DIAGNOSIS — Z8546 Personal history of malignant neoplasm of prostate: Secondary | ICD-10-CM | POA: Diagnosis not present

## 2023-03-12 DIAGNOSIS — I10 Essential (primary) hypertension: Secondary | ICD-10-CM | POA: Diagnosis not present

## 2023-03-12 DIAGNOSIS — I129 Hypertensive chronic kidney disease with stage 1 through stage 4 chronic kidney disease, or unspecified chronic kidney disease: Secondary | ICD-10-CM | POA: Diagnosis not present

## 2023-03-14 ENCOUNTER — Telehealth: Payer: Self-pay | Admitting: Neurology

## 2023-03-14 NOTE — Telephone Encounter (Signed)
Pt's wife called to confirm the pt's appt and once the information was given she proceeded to say "He is being scammed" I asked her what she meant and she said "I just need you to document exactly what Im saying, He is being scammed" I asked her does that have to do with what the pt is being seen for and she said yes. She again repeated "Just go ahead and document in there that he is being scammed." I told her that I will, she thanked me and call was ended.

## 2023-03-14 NOTE — Telephone Encounter (Addendum)
I see from a previous message that wife called about pt having some behavioral changes, recommended see pcp.  Saw pcp 03-12-2023.  No referral that I see.

## 2023-03-18 ENCOUNTER — Telehealth: Payer: Self-pay | Admitting: Neurology

## 2023-03-18 ENCOUNTER — Encounter: Payer: Self-pay | Admitting: Neurology

## 2023-03-18 ENCOUNTER — Ambulatory Visit: Payer: Medicare Other | Admitting: Neurology

## 2023-03-18 VITALS — BP 138/82 | HR 60 | Wt 165.0 lb

## 2023-03-18 DIAGNOSIS — R251 Tremor, unspecified: Secondary | ICD-10-CM

## 2023-03-18 DIAGNOSIS — G20C Parkinsonism, unspecified: Secondary | ICD-10-CM | POA: Diagnosis not present

## 2023-03-18 NOTE — Progress Notes (Signed)
Subjective:    Patient ID: William Lindsey is a 77 y.o. male.  HPI    Interim history:   Mr. William Lindsey is a 77 year old male with an underlying medical history of prostate cancer, hyperlipidemia, hypertension, and mildly overweight state, who presents for follow-up consultation of his tremors and parkinsonian features.  The patient is unaccompanied today.  I first met him at the request of his primary care physician on 04/16/2022, at which time he reported a longstanding history of tremors affecting both hands, right side more than left.  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  We talked about utilizing symptomatic medication in the near future.  He had some features of parkinsonism including increased tone in the right upper extremity and a resting tremor in the right upper extremity.  I suggested we proceed with a DaTscan.  We had to cancel his DaTscan because of elevated creatinine level.  He was started on propranolol in late November 2023.  His wife called in the interim reporting he had behavioral changes, he was spending large amounts of money.  She was advised to discuss this further with his PCP for evaluation medically.  Patient's wife called a few days ago reporting that he was being scammed but she did not leave any details.  Today, 03/18/2023: He reports that the propranolol did not help his tremor.  He denies any side effects including lightheadedness or dizziness or sleepiness from it.  His wife has not noticed any improvement in his tremor.  He sees urology regularly because of his history of prostate cancer.  He does not hydrate very well, estimates that he drinks about a bottle of water per day.  He drinks iced tea, about 2 cups/day and half cup of coffee in the morning.  He does not currently drink any alcohol.  No history of daily or regular alcohol use in the past, no heavy use.  He has not fallen.  He had a physical with his primary care about a week ago, they  did not discuss any behavioral changes with PCP even though we had encouraged them to address this.  His wife reports that he does not have any memory issues.  He denies any memory issues but admits that he was caught in an online scam.  He had blood work through PCP which I reviewed, creatinine level 1.4, he had a lipid panel, PSA, and CBC at the time.    The patient's allergies, current medications, family history, past medical history, past social history, past surgical history and problem list were reviewed and updated as appropriate.  Previously:   04/11/2022: (He) reports a longstanding history of bilateral upper extremity tremors, particularly affecting the right side with slow progression over time.  He has noticed significant trembling on the right side in the past year.  He has not fallen.  Tremor has not been bothersome until this past year.  He has trouble holding something, often switches to the left hand for better control.  He is right-handed.  He is retired, he was in retail, has no family history of tremors.  Father died at 73 with cancer, mom lived to be 86, she had dementia.  He has 1 sister who is 36 has no tremors.  He has 4 grown children, none with tremors.  He has not noticed any tremor in the upper body, such as neck or voice.  He has 8 grandchildren and 3 great-grandchildren.  He drinks alcohol rarely in the form  of wine, he drinks caffeine in the form of coffee, half a cup per day and 2 glasses of tea.  He has been a non-smoker.  I reviewed your office note from 03/06/2022.  He had blood work through your office on 03/02/2022 and I reviewed the results: CBC with differential showed hemoglobin mildly below normal at 13.5, hematocrit below normal at 39.3, otherwise benign findings, CMP showed benign findings with sodium 139, potassium 4.1, BUN 20, creatinine borderline at 1.46.  Lipid profile with benign findings.  I do not see a recent TSH in his chart.  He reports that his tremor has  been ongoing for about 20 years even.  He has never had any medication for this.   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    Wears glasses     His Past Surgical History Is Significant For: Past Surgical History:  Procedure Laterality Date   APPENDECTOMY  teen   COLONOSCOPY  last one 07/ 2017   INGUINAL HERNIA REPAIR Left 1993   KNEE ARTHROSCOPY Bilateral last one -- left 01/ 2017   PROSTATE BIOPSY     multiple   RADIOACTIVE SEED IMPLANT N/A 01/26/2016   Procedure: RADIOACTIVE SEED IMPLANT/BRACHYTHERAPY IMPLANT;  Surgeon: Sebastian Ache, MD;  Location: [facility];  Service: Urology;  Laterality: N/A;   SHOULDER ARTHROSCOPY WITH OPEN ROTATOR CUFF REPAIR Left early 2000's   TONSILLECTOMY  child    His Family History Is Significant For: Family History  Problem Relation Age of Onset   Alzheimer's disease Mother    Lung cancer Father    Breast cancer Sister    Parkinson's disease Neg Hx    Tremor Neg Hx     His Social History Is Significant For: Social History   Socioeconomic History   Marital status: Married    Spouse name: Diane   Number of children: 4   Years of education: Not on file   Highest education level: Bachelor's degree (e.g., BA, AB, BS)  Occupational History   Not on file  Tobacco Use   Smoking status: Never   Smokeless tobacco: Never  Vaping Use   Vaping status: Never Used  Substance and Sexual Activity   Alcohol use: No   Drug use: No   Sexual activity: Yes  Other Topics Concern   Not on file  Social History  Narrative   Lives with Wife   Right Handed   No children Living with him   1/2 cup of Coffee a Day   Social Determinants of Health   Financial Resource Strain: Not on file  Food Insecurity: Low Risk  (02/27/2023)   Received from [facility]   Hunger Vital Sign    Worried About Running Out of Food in the Last Year: Never true    Ran Out of Food in the Last Year: Never true  Transportation Needs: No Transportation Needs (02/27/2023)   Received from [facility]    In the past 12 months, has lack of reliable transportation kept  you from medical appointments, meetings, work or from getting things needed for daily living? : No  Physical Activity: Not on file  Stress: Not on file  Social Connections: Not on file    His Allergies Are:  Allergies  Allergen Reactions   Lisinopril Other (See Comments)    Or tickle in throat  :   His Current Medications Are:  Outpatient Encounter Medications as of 03/18/2023  Medication Sig   atorvastatin (LIPITOR) 20 MG tablet Take 20 mg by mouth daily.   losartan (COZAAR) 50 MG tablet Take 1 tablet daily for blood pressure control.   Multiple Vitamins-Minerals (PRESERVISION AREDS PO) Take by mouth.   propranolol (INDERAL) 20 MG tablet TAKE 1 TABLET(20 MG) BY MOUTH THREE TIMES DAILY   No facility-administered encounter medications on file as of 03/18/2023.  :  Review of Systems:  Out of a complete 14 point review of systems, all are reviewed and negative with the exception of these symptoms as listed below:   Review of Systems  Neurological:        Pt states tremors in right and left hand    Objective:  Neurological Exam  Physical Exam Physical Examination:   Vitals:   03/18/23 0737  BP: 138/82  Pulse: 60    General Examination: The patient is a very pleasant 77 y.o. male in no acute distress. He appears well-developed and well-nourished and well groomed.   HEENT: Normocephalic, atraumatic, pupils are equal,  round and reactive to light, extraocular tracking is good without limitation to gaze excursion or nystagmus noted. Hearing is grossly intact, right hearing aid in place. Face is symmetric with possibly mild facial masking.  Mild nuchal rigidity was detected today.  Speech is possibly mildly hypophonic, no dysarthria noted, no voice tremor.  There are no carotid bruits on auscultation. Oropharynx exam reveals: Mild to moderate mouth dryness, otherwise no abnormal tongue movements.  No facial dyskinesias.    Chest: Clear to auscultation without wheezing, rhonchi or crackles noted.   Heart: S1+S2+0, regular and normal without murmurs, rubs or gallops noted.    Abdomen: Soft, non-tender and non-distended.   Extremities: There is no pitting edema in the distal lower extremities bilaterally.    Skin: Warm and dry without trophic changes noted.    Musculoskeletal: exam reveals no obvious joint deformities.    Neurologically:  Mental status: The patient is awake, alert and oriented in all 4 spheres. His immediate and remote memory, attention, language skills and fund of knowledge are appropriate, not very forthcoming with history, wife provides limited additional information. There is no evidence of aphasia, agnosia, apraxia or anomia. Speech is clear with normal prosody and enunciation. Thought process is linear. Mood is normal and affect is normal.  Cranial nerves II - XII are as described above under HEENT exam.  Motor exam: Normal bulk, strength and tone is noted, with the exception of mild cogwheeling in the right upper extremity.  He has a resting tremor in the right upper extremity, he has a mild postural tremor in both upper extremities, slight action tremor in both upper extremities, no lower extremity tremor.      (On 04/11/2022: On Archimedes spiral drawing he has a mild to moderate trembling with both hands, handwriting is legible, he can write in cursive, very slightly tremulous, not  particularly micrographic.)    Fine motor skills and coordination: He has mild impairment with finger taps and hand movements and rapid alternating patting, slightly worse on the right.  No significant impairment of foot taps bilaterally, no lateralization.     Cerebellar testing: No dysmetria or intention tremor. There is no truncal or gait ataxia.  Normal finger-to-nose, normal heel-to-shin.   Sensory exam: intact to light touch in the upper and lower extremities.   Gait, station and balance: He stands without difficulty and posture is age-appropriate.  He walks with good pace and good stride length but slight decrease in arm swing on the right.  Noticeable hand tremor on the right while walking.  He turns without difficulty, balance is preserved.   Assessment and Plan:    In summary, KEEDAN PARTEN is a 77 year old male with an underlying medical history of prostate cancer, hyperlipidemia, hypertension, and mildly overweight state, who presents for follow-up consultation of his hand tremors.  He reported a hand tremor of 20 years duration but had noticed a worsening over the past couple years.  He has probably some features of essential tremor but current exam favors parkinsonism.  He has  right-sided lateralization.  He did not notice any benefit from propranolol and is therefore advised to taper off of it by reducing it to twice daily for 3 days, then once daily for 3 days, then stop.  We may consider medication for Parkinson's disease in the near future.  For now, we will proceed with a brain MRI without contrast.  He has had elevated creatinine levels and I favor the MRI to be without contrast to not be too taxing on his kidney function.  He is advised to hydrate better with water, 6 to 8 cups of water per day recommended, 8 ounce size each.  He currently drinks about 1 bottle of water per day.  For any behavioral changes, he is advised to make a follow-up appointment with PCP to discuss this  further.  Patient and his wife deny any memory issues.  He is advised to get additional workup done medically including vitamin deficiencies, evaluate for thyroid dysfunction, and they can also discuss the possibility of seeing a nephrologist for his impaired kidney function.  I suggested a follow-up in this clinic in about 3 months, sooner if needed.  We will keep him posted as to his brain MRI results by phone call in the interim and consider medication such as levodopa after the scan.  I answered all their questions today and the patient and his wife were in agreement.  We may consider a DaTscan down the road.  His kidney function in my opinion is not good enough to safely proceed with a DaTscan at this time. I spent 30 minutes in total face-to-face time and in reviewing records during pre-charting, more than 50% of which was spent in counseling and coordination of care, reviewing test results, reviewing medications and treatment regimen and/or in discussing or reviewing the diagnosis of parkinsonism, tremor, the prognosis and treatment options. Pertinent laboratory and imaging test results that were available during this visit with the patient were reviewed by me and considered in my medical decision making (see chart for details).

## 2023-03-18 NOTE — Telephone Encounter (Signed)
Pt requesting that Dr. Frances Furbish discuss with daughter about having the parkinson's test. Informed Mr. Cary due to daughter is not on DPR Dr.  Frances Furbish would not be able to discuss health information. Mr. Kurman ask if could email him the DPR form for him to fill out. Transferred to Medical Record to discuss.

## 2023-03-18 NOTE — Patient Instructions (Addendum)
We will take you off the propranolol: reduce to twice daily for 3 days, then once daily for 3 days, then stop.  We may consider a medication for Parkinson's disease next.  We will do a brain scan, called MRI and call you with the test results. We will have to schedule you for this on a separate date. This test requires authorization from your insurance, and we will take care of the insurance process. For your recent changes in behavior, please make an appointment with your primary care. I recommend getting checked for vitamin deficiencies and thyroid function.  Please hydrate better with water, 6-8 cups per day are recommended (8 oz size each).

## 2023-03-19 ENCOUNTER — Telehealth: Payer: Self-pay | Admitting: Neurology

## 2023-03-19 NOTE — Telephone Encounter (Signed)
[employer] medicare Berkley Harvey: [phone] exp. 03/19/23-04/17/23 sent to GI [phone]

## 2023-03-22 ENCOUNTER — Telehealth: Payer: Self-pay | Admitting: Neurology

## 2023-03-22 NOTE — Telephone Encounter (Signed)
Pt's daughter on DPR called needing to speak to the provider regarding the medications that the pt was putt on and his possible Parkinson's. Please advise.

## 2023-03-25 NOTE — Telephone Encounter (Signed)
I do recommend patient FU with PCP and address behavioral concerns with PCP first, as they were not mentioned or addressed during last PCP visit. I made detailed recommendations during my recent appointment. We will keep our FU as planned as well.

## 2023-03-25 NOTE — Telephone Encounter (Signed)
I called daughter of pt who is a Careers information officer.  She had questions about his recent visit.  His creatinine level is elevated so datscan is not option at this time.  A MRI ordered and is scheduled for 3 weeks.  Then she asked if could be scheduled for a neuropsych eval due to his cognitive changes.  I relayed the last office visit note recommendations.  She felt like the pcp has done what they know to do and feels that neurologist is next step and would like to proceed with neuropsych testing.  I relayed would be glad to ask.  The wait time for this is into next year unfortunately.

## 2023-03-25 NOTE — Telephone Encounter (Signed)
I called daughter back.  I relayed that per Dr. Frances Furbish was not addressed at pcp and Dr. Frances Furbish would have pt see them first.  I will fax last OV note to Dr. Andrey Campanile.  Daughter verbalized understanding. Faxed to pcp.

## 2023-04-01 ENCOUNTER — Telehealth: Payer: Self-pay | Admitting: Neurology

## 2023-04-01 MED ORDER — PROPRANOLOL HCL 20 MG PO TABS: 20.0000 mg | ORAL_TABLET | Freq: Three times a day (TID) | ORAL | 2 refills | Status: DC

## 2023-04-01 MED ORDER — PROPRANOLOL HCL 20 MG PO TABS: 20.0000 mg | ORAL_TABLET | Freq: Three times a day (TID) | ORAL | 5 refills | Status: DC

## 2023-04-01 NOTE — Addendum Note (Signed)
Addended by: Guy Begin on: 04/01/2023 04:24 PM   Modules accepted: Orders

## 2023-04-01 NOTE — Telephone Encounter (Signed)
I called wife of pt.  Pt did the taper of propranolol 20mg  po tid and has been off for 2-3 days.  Has noted increase in hand tremors and would like to go back on medication.  His MRI is scheduled this Thursday.

## 2023-04-01 NOTE — Telephone Encounter (Signed)
Okay to restart propranolol.

## 2023-04-01 NOTE — Telephone Encounter (Signed)
Wife has called back asking if pt can be put back on propranolol (INDERAL) 20 MG tablet .  Wife making this request because she states since pt has been off, his tremor has worsen.

## 2023-04-01 NOTE — Telephone Encounter (Signed)
I relayed to pt and wife that Dr. Frances Furbish did ok to restart the propranolol.  I stated sent in the prescription to [facility] [place].  20mg  po tid.  I did relay that he was initially titrated to 20mg  po daily for a week, the 20mg   po BID for wk then TID for a week.  Since been off for only 2 days, he said he was going to go ahead and take tid as he had no issues previously.

## 2023-04-01 NOTE — Telephone Encounter (Signed)
Pt's wife called and LVM stating that the pt has been taken off of his Tremor Medications and she is needing to discuss this with the RN. Please advise.

## 2023-04-04 ENCOUNTER — Telehealth: Payer: Self-pay | Admitting: *Deleted

## 2023-04-04 ENCOUNTER — Ambulatory Visit
Admission: RE | Admit: 2023-04-04 | Discharge: 2023-04-04 | Disposition: A | Discharge status: Home / Self Care | Payer: Medicare Other | Source: Ambulatory Visit | Attending: Neurology | Admitting: Neurology

## 2023-04-04 ENCOUNTER — Other Ambulatory Visit: Payer: Self-pay | Admitting: *Deleted

## 2023-04-04 DIAGNOSIS — G20C Parkinsonism, unspecified: Secondary | ICD-10-CM | POA: Diagnosis not present

## 2023-04-04 DIAGNOSIS — R251 Tremor, unspecified: Secondary | ICD-10-CM

## 2023-04-04 DIAGNOSIS — G20B2 Parkinson's disease with dyskinesia, with fluctuations: Secondary | ICD-10-CM | POA: Diagnosis not present

## 2023-04-04 MED ORDER — CARBIDOPA-LEVODOPA 25-100 MG PO TABS: 0.5000 | ORAL_TABLET | Freq: Three times a day (TID) | ORAL | 2 refills | Status: DC

## 2023-04-04 NOTE — Telephone Encounter (Signed)
I spoke with the patient and we discussed the Sinemet prescription that Dr. Frances Furbish prescribed.  We discussed the instructions, the potential side effects.  He verbalized understanding with all of it.  I touched base with Dr. Frances Furbish who ideally would like for the patient to stop the propranolol and be on the Sinemet alone.  The patient agreed to this plan.  His questions were answered and he was very appreciative for the call.

## 2023-04-04 NOTE — Telephone Encounter (Signed)
Propranolol discontinued from medication list.

## 2023-04-04 NOTE — Telephone Encounter (Signed)
We can start Sinemet (generic name: carbidopa-levodopa) 25/100 mg: Take half a pill 3 times a day.  Please advise patient to try to take the medication away from the mealtimes, that is, ideally either one hour before or 2 hours after your meal to ensure optimal absorption. The medication can interfere with the protein content of your meal and trying to the protein in your food and therefore not get fully absorbed.  Common side effects reported are: Nausea, vomiting, sedation, confusion, lightheadedness. Rare side effects include hallucinations, severe nausea or vomiting, diarrhea and significant drop in blood pressure especially when going from lying to standing or from sitting to standing.

## 2023-04-04 NOTE — Telephone Encounter (Signed)
-----   Message from Huston Foley sent at 04/04/2023  4:45 PM EST ----- Please advise patient or wife, that his recent brain MRI without contrast did not show any acute findings.  It was reported as unremarkable for age.

## 2023-04-04 NOTE — Telephone Encounter (Signed)
Spoke to pt gave MRI results Pt states is there anything that Dr Frances Furbish suggest to help tremors Pt states taking propanolol 3x daily Pt states little changes. Will forward  question to Dr Frances Furbish

## 2023-05-13 ENCOUNTER — Telehealth: Payer: Self-pay | Admitting: Neurology

## 2023-05-13 NOTE — Telephone Encounter (Signed)
Pt's wife confirming upcoming appt details

## 2023-06-18 ENCOUNTER — Ambulatory Visit: Payer: Medicare Other | Admitting: Neurology

## 2023-06-18 ENCOUNTER — Encounter: Payer: Self-pay | Admitting: Neurology

## 2023-06-18 VITALS — BP 128/78 | HR 76 | Ht 68.0 in | Wt 170.0 lb

## 2023-06-18 DIAGNOSIS — G20C Parkinsonism, unspecified: Secondary | ICD-10-CM | POA: Diagnosis not present

## 2023-06-18 MED ORDER — CARBIDOPA-LEVODOPA 25-100 MG PO TABS: 1.0000 | ORAL_TABLET | Freq: Four times a day (QID) | ORAL | 3 refills | Status: DC

## 2023-06-18 NOTE — Progress Notes (Signed)
Subjective:    Patient ID: William Lindsey is a 78 y.o. male.  HPI    Interim history:   William Lindsey is a 78 year old male with an underlying medical history of prostate cancer, hyperlipidemia, hypertension, and mildly overweight state, who presents for follow-up consultation of William tremors and parkinsonian features.  The patient is accompanied by William Lindsey today.  I last saw him on 03/18/2023, at which time he reported having no benefit from propranolol.  He was advised to gradually taper off of it.  He was advised to limit William caffeine and increase William water intake as he was only drinking 1 bottle of water per day.  He was encouraged to make an appointment with William PCP to discuss behavioral changes.  William Lindsey reported that he did not have any significant memory changes but that he was caught in an online scam.  He had mild impairment of William kidney function with a creatinine of 1.4.  I recommend that we proceed with a brain MRI without contrast and consider a DaTscan at a later date for possibility of some features of parkinsonism.  He had a brain MRI without contrast on 04/04/2023 and I reviewed the results:   IMPRESSION:    Unremarkable MRI brain without contrast.  No acute findings.   They were notified of the test results by phone call.  In addition, I personally and independently reviewed images through the PACS system.  I noted stage appropriate chronic changes.    William Lindsey called in November 2024 reporting that he had worsening of the tremor after stopping the propranolol and they requested to restart it.  He was restarted on propranolol.  William Lindsey called back a few days later in November 2024 reporting that the propranolol was not helping.  I suggested we start him on a trial of low-dose Sinemet half a pill 3 times daily, 25-100 mg strength.  He was advised to stop taking the propranolol when starting the levodopa.   Today, 06/18/2023: He reports doing about the same, maybe tremor a little  worse.  William Lindsey has noticed a tremor in the left hand as well as right hand, he takes the Sinemet half a pill 3 times daily, typically first dose around 10, second dose between 2:30 PM and 3 PM and last dose around 7:30 PM or 8:30 PM.  He tolerates it but has not noticed much in the way of difference in William tremor.  He has not fallen.  No dizziness, no lightheadedness, no significant daytime somnolence but does have a tendency to doze off when watching TV in the evening per Lindsey.  No significant behavioral changes at this time and both feel that he is doing well and is stable.  No new cognitive concerns.  He tries to hydrate well with water and estimates that he drinks about 3 bottles of water per day.  He does not drink any alcohol currently, drinks very little caffeine in the form of coffee, usually half a cup in the morning.     The patient's allergies, current medications, family history, past medical history, past social history, past surgical history and problem list were reviewed and updated as appropriate.    Previously:  I first met him at the request of William primary care physician on 04/16/2022, at which time he reported a longstanding history of tremors affecting both hands, right side more than left.  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  We talked about utilizing  symptomatic medication in the near future.  He had some features of parkinsonism including increased tone in the right upper extremity and a resting tremor in the right upper extremity.  I suggested we proceed with a DaTscan.  We had to cancel William DaTscan because of elevated creatinine level.  He was started on propranolol in late November 2023.   William Lindsey called in the interim reporting he had behavioral changes, he was spending large amounts of money.  She was advised to discuss this further with William PCP for evaluation medically.  Patient's Lindsey called a few days ago reporting that he was being scammed but she  did not leave any details.     04/11/2022: (He) reports a longstanding history of bilateral upper extremity tremors, particularly affecting the right side with slow progression over time.  He has noticed significant trembling on the right side in the past year.  He has not fallen.  Tremor has not been bothersome until this past year.  He has trouble holding something, often switches to the left hand for better control.  He is right-handed.  He is retired, he was in retail, has no family history of tremors.  Father died at 27 with cancer, mom lived to be 35, she had dementia.  He has 1 sister who is 57 has no tremors.  He has 4 grown children, none with tremors.  He has not noticed any tremor in the upper body, such as neck or voice.  He has 8 grandchildren and 3 great-grandchildren.  He drinks alcohol rarely in the form of wine, he drinks caffeine in the form of coffee, half a cup per day and 2 glasses of tea.  He has been a non-smoker.  I reviewed your office note from 03/06/2022.  He had blood work through your office on 03/02/2022 and I reviewed the results: CBC with differential showed hemoglobin mildly below normal at 13.5, hematocrit below normal at 39.3, otherwise benign findings, CMP showed benign findings with sodium 139, potassium 4.1, BUN 20, creatinine borderline at 1.46.  Lipid profile with benign findings.  I do not see a recent TSH in William chart.  He reports that William tremor has been ongoing for about 20 years even.  He has never had any medication for this.   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    Wears glasses     William Past Surgical History Is Significant For: Past Surgical History:  Procedure Laterality Date   APPENDECTOMY  teen   COLONOSCOPY  last one 07/ 2017   INGUINAL HERNIA REPAIR Left 1993   KNEE ARTHROSCOPY Bilateral last one -- left 01/ 2017   PROSTATE BIOPSY     multiple   RADIOACTIVE SEED IMPLANT N/A 01/26/2016   Procedure: RADIOACTIVE SEED IMPLANT/BRACHYTHERAPY IMPLANT;  Surgeon: Sebastian Ache, MD;  Location: [facility];  Service: Urology;  Laterality: N/A;   SHOULDER ARTHROSCOPY WITH OPEN ROTATOR CUFF REPAIR Left early 2000's   TONSILLECTOMY  child    William Family History Is  Significant For: Family History  Problem Relation Age of Onset   Alzheimer's disease Mother    Lung cancer Father    Breast cancer Sister    Parkinson's disease Neg Hx    Tremor Neg Hx     William Social History Is Significant For: Social History   Socioeconomic History   Marital status: Married    Spouse name: Diane   Number of children: 4   Years of education: Not on file   Highest education level: Bachelor's degree (e.g., BA, AB, BS)  Occupational History   Not on file  Tobacco Use   Smoking status: Never   Smokeless tobacco: Never  Vaping Use   Vaping status: Never Used  Substance and Sexual Activity   Alcohol use: No   Drug use: No   Sexual activity: Yes  Other Topics Concern   Not on file  Social History Narrative   Lives with Lindsey   Right Handed   No children Living with him   1/2 cup of Coffee a Day   Social Drivers of Corporate investment banker Strain: Not on file  Food Insecurity: Low Risk  (02/27/2023)   Received from [facility]   Hunger Vital Sign    Worried About Running Out of Food in the Last Year: Never true    Ran Out of Food in the Last Year: Never true  Transportation Needs: No Transportation Needs (02/27/2023)   Received from [facility]    In the past 12 months, has lack of  reliable transportation kept you from medical appointments, meetings, work or from getting things needed for daily living? : No  Physical Activity: Not on file  Stress: Not on file  Social Connections: Not on file    William Allergies Are:  Allergies  Allergen Reactions   Lisinopril Other (See Comments)    Or tickle in throat  :   William Current Medications Are:  Outpatient Encounter Medications as of 06/18/2023  Medication Sig   atorvastatin (LIPITOR) 20 MG tablet Take 20 mg by mouth daily.   carbidopa-levodopa (SINEMET IR) 25-100 MG tablet Take 0.5 tablets by mouth 3 (three) times daily.   losartan (COZAAR) 50 MG tablet Take 1 tablet daily for blood pressure control.   Multiple Vitamins-Minerals (PRESERVISION AREDS PO) Take by mouth.   No facility-administered encounter medications on file as of 06/18/2023.  :  Review of Systems:  Out of a complete 14 point review of systems, all are reviewed and negative with the exception of these symptoms as listed below:   Review of Systems  Neurological:        Patient in room #5  with William Lindsey. Patient states William tremors has gotten worse.    Objective:  Neurological Exam  Physical Exam Physical Examination:   Vitals:   06/18/23 0921  BP: 128/78  Pulse: 76    General Examination: The patient is a very pleasant 78 y.o. male in no acute distress. He appears well-developed and well-nourished and well groomed.   HEENT: Normocephalic, atraumatic, pupils are equal, round and reactive to light, extraocular tracking is good without limitation to gaze excursion or nystagmus noted. Hearing is grossly intact, right hearing aid in place. Face is symmetric with mild facial masking.  Mild nuchal rigidity was detected today.  Speech is possibly mildly hypophonic, no dysarthria noted, no voice tremor.  There are no carotid bruits on auscultation. Oropharynx exam reveals: Mild to moderate mouth dryness, otherwise no abnormal  tongue movements.  No facial  dyskinesias.    Chest: Clear to auscultation without wheezing, rhonchi or crackles noted.   Heart: S1+S2+0, regular and normal without murmurs, rubs or gallops noted.    Abdomen: Soft, non-tender and non-distended.   Extremities: There is no pitting edema in the distal lower extremities bilaterally.    Skin: Warm and dry without trophic changes noted.    Musculoskeletal: exam reveals no obvious joint deformities.    Neurologically:  Mental status: The patient is awake, alert and oriented in all 4 spheres. William immediate and remote memory, attention, language skills and fund of knowledge are appropriate, Lindsey supplements history.  There is no evidence of aphasia, agnosia, apraxia or anomia. Speech is clear with normal prosody and enunciation. Thought process is linear. Mood is normal and affect is normal.  Cranial nerves II - XII are as described above under HEENT exam.  Motor exam: Normal bulk, strength and tone is noted, with the exception of mild cogwheeling in the right upper extremity.  He has a resting tremor in the right upper extremity, he has a mild postural tremor in both upper extremities, slight action tremor in both upper extremities, no lower extremity tremor.  There is slight and intermittent resting tremor in the left thumb and index finger area.   (On 04/11/2022: On Archimedes spiral drawing he has a mild to moderate trembling with both hands, handwriting is legible, he can write in cursive, very slightly tremulous, not particularly micrographic.)    Fine motor skills and coordination: He has mild impairment with finger taps and hand movements and rapid alternating patting, slightly worse on the right.  No significant impairment of foot taps bilaterally, no lateralization.     Cerebellar testing: No dysmetria or intention tremor. There is no truncal or gait ataxia.  Normal finger-to-nose, normal heel-to-shin.   Sensory exam: intact to light touch in the upper and lower  extremities.    Gait, station and balance: He stands without difficulty and posture is age-appropriate.  He walks with good pace and good stride length but mild decrease in arm swing on the right.  Noticeable hand tremor on the right while walking.  He turns without difficulty, balance is preserved.   Assessment and Plan:    In summary, ALLANTE WHITMIRE is a 78 year old male with an underlying medical history of prostate cancer, hyperlipidemia, hypertension, and mildly overweight state, who presents for follow-up consultation of William tremor disorder of several years duration.  He has a history of hand tremor for at least 20 years but has had worsening in the past few years.  While he has a history more supportive of essential tremor in the past, William recent history and examination are favoring parkinsonism with right sided lateralization noted.  He did not have any sustained benefit from propranolol and is currently off of it.  We started him on levodopa therapy in November 2024.  He is currently on half a pill 3 times daily without any benefit noted but no significant side effects reported.  William brain MRI without contrast from November 2024 did not show any acute findings or any structural changes.  Today, we mutually agreed to proceed with a DaTscan.  I explained the scan to the patient and William Lindsey.  We will double check William chemistry panel to make sure William kidney function is okay to proceed.  I would also like to increase William levodopa to 1 pill 4 times daily at this time starting at 10  AM, 4 hourly.  We will monitor William balance and cognitive function.  He has no new concerns at this time and is agreeable to our plan.   He is reminded to stay well-hydrated with water and limit William caffeine.  I suggested a follow-up in this clinic in about 4 months, sooner if needed.  We will keep him posted as to William DaTscan results by phone call in the interim as well.  I answered all their questions today and patient and  William Lindsey are in agreement. I spent 40 minutes in total face-to-face time and in reviewing records during pre-charting, more than 50% of which was spent in counseling and coordination of care, reviewing test results, reviewing medications and treatment regimen and/or in discussing or reviewing the diagnosis of parkinsonism, the prognosis and treatment options. Pertinent laboratory and imaging test results that were available during this visit with the patient were reviewed by me and considered in my medical decision making (see chart for details).

## 2023-06-18 NOTE — Patient Instructions (Signed)
It was nice to see you again today.  As discussed, I will order a DaT scan: This is a specialized brain scan designed to help with diagnosis of tremor disorders. A radioactive marker gets injected and the uptake is measured in the brain and compared to normal controls and right side is compared to the left, a change in uptake can help with diagnosis of certain tremor disorders. A brain MRI on the other hand is a brain scan that helps look at the brain structure in more detail overall and look for age-related changes, blood vessel related changes and look for stroke and volume loss which we call atrophy.   We will increase your levodopa to 1 pill 4 times daily, take at 10 AM, 2 PM, 6 PM, and 10 PM daily.  Try to keep it away from her mealtimes as discussed, about an hour before you eat or about 2 hours after you eat if possible.  This allows for better absorption.  We will follow-up in about 4 months and schedule a DaTscan after your insurance approves the scan.

## 2023-06-19 ENCOUNTER — Telehealth: Payer: Self-pay | Admitting: *Deleted

## 2023-06-19 ENCOUNTER — Telehealth: Payer: Self-pay | Admitting: Neurology

## 2023-06-19 LAB — COMPREHENSIVE METABOLIC PANEL
ALT: 5 [IU]/L (ref 0–44)
AST: 12 [IU]/L (ref 0–40)
Albumin: 3.8 g/dL (ref 3.8–4.8)
Alkaline Phosphatase: 58 [IU]/L (ref 44–121)
BUN/Creatinine Ratio: 15 (ref 10–24)
BUN: 21 mg/dL (ref 8–27)
Bilirubin Total: 0.5 mg/dL (ref 0.0–1.2)
CO2: 23 mmol/L (ref 20–29)
Calcium: 8.8 mg/dL (ref 8.6–10.2)
Chloride: 104 mmol/L (ref 96–106)
Creatinine, Ser: 1.4 mg/dL — ABNORMAL HIGH (ref 0.76–1.27)
Globulin, Total: 4.4 g/dL (ref 1.5–4.5)
Glucose: 98 mg/dL (ref 70–99)
Potassium: 4.8 mmol/L (ref 3.5–5.2)
Sodium: 140 mmol/L (ref 134–144)
Total Protein: 8.2 g/dL (ref 6.0–8.5)
eGFR: 51 mL/min/{1.73_m2} — ABNORMAL LOW (ref >59)

## 2023-06-19 NOTE — Telephone Encounter (Signed)
You're welcome!

## 2023-06-19 NOTE — Telephone Encounter (Signed)
[employer] medicare Berkley Harvey: [phone] exp. 06/19/23-07/18/23 sent to [facility]. [phone]

## 2023-06-19 NOTE — Telephone Encounter (Signed)
Spoke with Theodoro Grist @ [facility]. Ok to proceed with DaT scan but pt will require more time to pass in between injection and scan (4-4.5 hr likely instead of 3 hr). He will make a note on pt's file.

## 2023-06-19 NOTE — Telephone Encounter (Signed)
-----   Message from [facility] sent at 06/19/2023  9:02 AM EST ----- Stable, but mildly impaired kidney function. Not sure if he can have DaT scan. Is it possible for you to call [facility] Nucl med scan dept to inquire?

## 2023-06-19 NOTE — Telephone Encounter (Signed)
Okay, thank you. That is good to know. Thanks, [place], for reaching out to them at [facility].

## 2023-07-02 DIAGNOSIS — K080 Exfoliation of teeth due to systemic causes: Secondary | ICD-10-CM | POA: Diagnosis not present

## 2023-07-05 ENCOUNTER — Encounter (HOSPITAL_COMMUNITY)
Admission: RE | Admit: 2023-07-05 | Discharge: 2023-07-05 | Disposition: A | Discharge status: Home / Self Care | Payer: Medicare Other | Source: Ambulatory Visit | Attending: Neurology | Admitting: Neurology

## 2023-07-05 DIAGNOSIS — G20C Parkinsonism, unspecified: Secondary | ICD-10-CM | POA: Diagnosis not present

## 2023-07-05 MED ORDER — POTASSIUM IODIDE (ANTIDOTE) 130 MG PO TABS
ORAL_TABLET | ORAL | Status: AC
Start: 2023-07-05 — End: ?
  Filled 2023-07-05: qty 1

## 2023-07-05 MED ORDER — POTASSIUM IODIDE (ANTIDOTE) 130 MG PO TABS
130.0000 mg | ORAL_TABLET | Freq: Once | ORAL | Status: AC
  Administered 2023-07-05: 130 mg via ORAL

## 2023-07-08 DIAGNOSIS — R930 Abnormal findings on diagnostic imaging of skull and head, not elsewhere classified: Secondary | ICD-10-CM | POA: Diagnosis not present

## 2023-07-08 DIAGNOSIS — G252 Other specified forms of tremor: Secondary | ICD-10-CM | POA: Diagnosis not present

## 2023-07-08 MED ORDER — IOFLUPANE I 123 185 MBQ/2.5ML IV SOLN
4.7500 | Freq: Once | INTRAVENOUS | Status: AC | PRN
  Administered 2023-07-05: 4.75 via INTRAVENOUS

## 2023-07-09 ENCOUNTER — Encounter: Payer: Self-pay | Admitting: Neurology

## 2023-07-10 ENCOUNTER — Telehealth: Payer: Self-pay | Admitting: *Deleted

## 2023-07-10 NOTE — Telephone Encounter (Signed)
 I called pt and relayed the results of the DATSCAN per Dr Frances Furbish per below.  Pt had seen report on mychart. I told him that will send her result note to him as well.  [place] appt for him 07/29/2023 at 1245.  He appreciated call back.

## 2023-07-10 NOTE — Telephone Encounter (Signed)
-----   Message from Huston Foley sent at 07/09/2023  4:04 PM EST ----- Please call patient (or designated party on DPR) regarding the recent nuclear medicine DaT scan result. As discussed, this is a specialized brain scan designed to aid with the diagnosis of tremor disorders, including parkinsonian disorders. A radioactive marker gets injected and the uptake is measured in the brain and compared to normal controls and right side of the brain is compared to the left side. A change in uptake can help with diagnosis of certain tremor disorders and narrow down the diagnostic possibilities. The patient's recent scan indicated abnormal (as in lower) uptake as compared to normal uptake pattern indicating an underlying parkinsonian disorder, as we suspected.  Please advise pt to FU as scheduled.

## 2023-07-29 ENCOUNTER — Telehealth: Payer: Self-pay | Admitting: Neurology

## 2023-07-29 ENCOUNTER — Ambulatory Visit: Payer: Medicare Other | Admitting: Neurology

## 2023-07-29 NOTE — Telephone Encounter (Signed)
 LVM and sent mychart msg informing pt of need to reschedule 07/29/23 appt - MD out

## 2023-07-30 ENCOUNTER — Ambulatory Visit: Admitting: Neurology

## 2023-07-30 ENCOUNTER — Encounter: Payer: Self-pay | Admitting: Neurology

## 2023-07-30 VITALS — BP 126/69 | HR 89 | Ht 69.0 in | Wt 172.8 lb

## 2023-07-30 DIAGNOSIS — G20C Parkinsonism, unspecified: Secondary | ICD-10-CM

## 2023-07-30 NOTE — Patient Instructions (Signed)
 I would like for you to see your dermatologist.  I am not sure if you are red bumps on the abdomen and trunk area or from the Sinemet or not.  I have certainly not seen this before.  It is not impossible to develop a rash from any new medication but you have been on it since November. For now, if tolerated, continue with the levodopa 1 pill 4 times a day.  If your rash gets significantly worse or becomes itchy and you feel worse, please proceed to the ER and stop the Sinemet altogether to be safe.  If possible, I would like to increase it if your dermatologist has seen you and does not believe it is connected to the Sinemet.  Follow-up in about 3 months.  If need be, we will consider a different medication down the road.  We may consider a referral to Dr. Arbutus Leas in [place] or neurology at [facility] or [facility] for deep brain stimulation consultation.

## 2023-07-30 NOTE — Progress Notes (Signed)
 Subjective:    Patient ID: William Lindsey is a 78 y.o. male.  HPI    Interim history:   William Lindsey is a 78 year old male with an underlying medical history of prostate cancer, hyperlipidemia, hypertension, and mildly overweight state, who presents for follow-up consultation of his tremors and parkinsonism, after interim DaTscan.  The patient is accompanied by his wife today; his daughter is on her phone for the major portion of the visit.  I last saw him on 06/18/2023, at which time he was advised to increase his levodopa to 1 pill 4 times a day, starting at 10 AM, 4 hourly.  He was advised to keep his mealtimes separate from his medication time to allow for better absorption.  He was advised to proceed with a DaTscan.  He had an interim brain DaTscan on 07/05/2023 and I reviewed the results:   IMPRESSION: Asymmetric decreased radiotracer activity in the posterior LEFT putamen could represent Parkinsonian syndrome pathology. Recommend clinical correlation.   Of note, DaTSCAN is not diagnostic of Parkinsonian syndromes, which remains a clinical diagnosis. DaTscan is an adjuvant test to aid in the clinical diagnosis of Parkinsonian syndromes.  In addition, I personally and independently reviewed images through the PACS system.  He was notified via phone call.     Today, 07/30/2023: He reports no significant improvement in his tremor or dexterity since increasing the levodopa to 1 pill 4 times a day.  A few weeks ago, about 2 or 3 weeks ago he noticed a rash on his lower abdomen and trunk area, it is mildly itchy, he has not tried any allergy medication for it.  It is not becoming worse but he has not tried any new medication and has not changed his soap or detergent.  He has an appointment with dermatology next week.  We talked about his DaTscan results today.  His daughter also asked if he could be a candidate for surgical treatment and we talked about a potential DBS referral down the road.    The patient's allergies, current medications, family history, past medical history, past social history, past surgical history and problem list were reviewed and updated as appropriate.      Previously:    06/18/2023: He reports doing about the same, maybe tremor a little worse.  His wife has noticed a tremor in the left hand as well as right hand, he takes the Sinemet half a pill 3 times daily, typically first dose around 10, second dose between 2:30 PM and 3 PM and last dose around 7:30 PM or 8:30 PM.  He tolerates it but has not noticed much in the way of difference in his tremor.  He has not fallen.  No dizziness, no lightheadedness, no significant daytime somnolence but does have a tendency to doze off when watching TV in the evening per wife.  No significant behavioral changes at this time and both feel that he is doing well and is stable.  No new cognitive concerns.  He tries to hydrate well with water and estimates that he drinks about 3 bottles of water per day.  He does not drink any alcohol currently, drinks very little caffeine in the form of coffee, usually half a cup in the morning.       I first met him at the request of his primary care physician on 04/16/2022, at which time he reported a longstanding history of tremors affecting both hands, right side more than left.  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  We talked about utilizing symptomatic medication in the near future.  He had some features of parkinsonism including increased tone in the right upper extremity and a resting tremor in the right upper extremity.  I suggested we proceed with a DaTscan.  We had to cancel his DaTscan because of elevated creatinine level.  He was started on propranolol in late November 2023.   His wife called in the interim reporting he had behavioral changes, he was spending large amounts of money.  She was advised to discuss this further with his PCP for evaluation medically.   Patient's wife called a few days ago reporting that he was being scammed but she did not leave any details.     04/11/2022: (He) reports a longstanding history of bilateral upper extremity tremors, particularly affecting the right side with slow progression over time.  He has noticed significant trembling on the right side in the past year.  He has not fallen.  Tremor has not been bothersome until this past year.  He has trouble holding something, often switches to the left hand for better control.  He is right-handed.  He is retired, he was in retail, has no family history of tremors.  Father died at 66 with cancer, mom lived to be 68, she had dementia.  He has 1 sister who is 86 has no tremors.  He has 4 grown children, none with tremors.  He has not noticed any tremor in the upper body, such as neck or voice.  He has 8 grandchildren and 3 great-grandchildren.  He drinks alcohol rarely in the form of wine, he drinks caffeine in the form of coffee, half a cup per day and 2 glasses of tea.  He has been a non-smoker.  I reviewed your office note from 03/06/2022.  He had blood work through your office on 03/02/2022 and I reviewed the results: CBC with differential showed hemoglobin mildly below normal at 13.5, hematocrit below normal at 39.3, otherwise benign findings, CMP showed benign findings with sodium 139, potassium 4.1, BUN 20, creatinine borderline at 1.46.  Lipid profile with benign findings.  I do not see a recent TSH in his chart.  He reports that his tremor has been ongoing for about 20 years even.  He has never had any medication for this.    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    Wears glasses     His Past Surgical History Is Significant For: Past Surgical History:  Procedure Laterality Date   APPENDECTOMY  teen   COLONOSCOPY  last one 07/ 2017   INGUINAL HERNIA REPAIR Left 1993   KNEE ARTHROSCOPY Bilateral last one -- left 01/ 2017   PROSTATE BIOPSY     multiple   RADIOACTIVE SEED IMPLANT N/A 01/26/2016   Procedure: RADIOACTIVE SEED IMPLANT/BRACHYTHERAPY IMPLANT;  Surgeon: Sebastian Ache, MD;  [facility];  Service: Urology;  Laterality: N/A;   SHOULDER ARTHROSCOPY WITH OPEN ROTATOR CUFF REPAIR Left early 2000's  TONSILLECTOMY  child    His Family History Is Significant For: Family History  Problem Relation Age of Onset   Alzheimer's disease Mother    Lung cancer Father    Breast cancer Sister    Parkinson's disease Neg Hx    Tremor Neg Hx     His Social History Is Significant For: Social History   Socioeconomic History   Marital status: Married    Spouse name: Diane   Number of children: 4   Years of education: Not on file   Highest education level: Bachelor's degree (e.g., BA, AB, BS)  Occupational History   Not on file  Tobacco Use   Smoking status: Never   Smokeless tobacco: Never  Vaping Use   Vaping status: Never Used  Substance and Sexual Activity   Alcohol use: No   Drug use: No   Sexual activity: Yes  Other Topics Concern   Not on file  Social History Narrative   Lives with Wife   Right Handed   No children Living with him   1/2 cup of Coffee a Day   Social Drivers of Corporate investment banker Strain: Not on file  Food Insecurity: Low Risk  (02/27/2023)   Received from [facility]   Hunger Vital Sign    Worried About Running Out of Food in the Last Year: Never true    Ran Out of Food in the Last Year: Never true  Transportation Needs: No Transportation Needs (02/27/2023)   Received  from [facility]    In the past 12 months, has lack of reliable transportation kept you from medical appointments, meetings, work or from getting things needed for daily living? : No  Physical Activity: Not on file  Stress: Not on file  Social Connections: Not on file    His Allergies Are:  Allergies  Allergen Reactions   Lisinopril Other (See Comments)    Or tickle in throat  :   His Current Medications Are:  Outpatient Encounter Medications as of 07/30/2023  Medication Sig   atorvastatin (LIPITOR) 20 MG tablet Take 20 mg by mouth daily.   carbidopa-levodopa (SINEMET IR) 25-100 MG tablet Take 1 tablet by mouth 4 (four) times daily. At 10 AM, 2 PM, 6 PM and 10 PM daily   losartan (COZAAR) 50 MG tablet Take 1 tablet daily for blood pressure control.   Multiple Vitamins-Minerals (PRESERVISION AREDS PO) Take by mouth.   No facility-administered encounter medications on file as of 07/30/2023.  :  Review of Systems:  Out of a complete 14 point review of systems, all are reviewed and negative with the exception of these symptoms as listed below:  Review of Systems  Neurological:        Go over DATSCAN results.  Sinemet 25/100 1 tab qid, not much change. He states has rash on stomach, itches when increased sinemet (not sure if this), had non allergenic laundry change.     Objective:  Neurological Exam  Physical Exam Physical Examination:   Vitals:   07/30/23 1310  BP: 126/69  Pulse: 89    General Examination: The patient is a very pleasant 78 y.o. male in no acute distress. He appears well-developed and well-nourished and well groomed.   HEENT: Normocephalic, atraumatic, pupils are equal, round and reactive to light, extraocular tracking is good without limitation to gaze excursion or nystagmus noted. Hearing is grossly intact, right hearing aid in place. Face is symmetric with mild facial masking.  Mild nuchal rigidity was detected today.  Speech is mildly  hypophonic, no dysarthria noted, no voice tremor.  There are no carotid bruits on auscultation. Oropharynx exam reveals: Mild to moderate mouth dryness, otherwise no abnormal tongue movements.  No facial dyskinesias.    Chest: Clear to auscultation without wheezing, rhonchi or crackles noted.   Heart: S1+S2+0, regular and normal without murmurs, rubs or gallops noted.    Abdomen: Soft, non-tender and non-distended.   Extremities: There is no pitting edema in the distal lower extremities bilaterally.    Skin: Warm and dry with a pustular rash on the lower trunk and abdomen area, not a confluent skin rash but raised bumps, small papules without major areas of scratching.  No rash on the arms or hands or face or neck and upper chest areas.   Musculoskeletal: exam reveals no obvious joint deformities.    Neurologically:  Mental status: The patient is awake, alert and oriented in all 4 spheres. His immediate and remote memory, attention, language skills and fund of knowledge are appropriate, wife supplements history.  There is no evidence of aphasia, agnosia, apraxia or anomia. Speech is clear with normal prosody and enunciation. Thought process is linear. Mood is normal and affect is normal.  Cranial nerves II - XII are as described above under HEENT exam.  Motor exam: Normal bulk, strength and tone is noted, with the exception of mild cogwheeling in the right upper extremity.  He has a resting tremor in the right upper extremity, he has a mild postural tremor in both upper extremities, slight action tremor in both upper extremities, no lower extremity tremor.  There is slight and intermittent resting tremor in the left thumb and index finger area, seems stable.   (On 04/11/2022: On Archimedes spiral drawing he has a mild to moderate trembling with both hands, handwriting is legible, he can write in cursive, very slightly tremulous, not particularly micrographic.)    Fine motor skills and  coordination: He has mild impairment with finger taps and hand movements and rapid alternating patting, slightly worse on the right.  Very mild impairment of foot taps bilaterally, no lateralization.     Cerebellar testing: No dysmetria or intention tremor. There is no truncal or gait ataxia.  Normal finger-to-nose, normal heel-to-shin.   Sensory exam: intact to light touch in the upper and lower extremities.    Gait, station and balance: He stands without difficulty and posture is age-appropriate.  He walks with good pace and good stride length but mild decrease in arm swing on the right.  Noticeable hand tremor on the right while walking.  He turns without difficulty, balance is preserved.   Assessment and Plan:    In summary, William Lindsey is a 78 year old male with an underlying medical history of prostate cancer, hyperlipidemia, hypertension, and mildly overweight state, who presents for follow-up consultation of his tremor disorder of several years duration.  He has a history of hand tremor for at least 20 years but has had worsening in the past few years.  While he has a history more supportive of essential tremor in the past, his recent history and examination are favoring parkinsonism with right sided lateralization noted.  He did not have any sustained benefit from propranolol and is currently off of it.  We started him on levodopa therapy in November 2024.  He is currently on 1 pill 4 times a day since January 2025.  While he has not noticed any obvious effects, he also  has had no side effects but ran developed a pustular rash with scattered raised bumps in the lower trunk area.  He has an appointment with dermatology coming up and is willing to continue medication but is advised to stop it if it becomes worse.  I would like to increase the levodopa but we mutually agreed to keep it at the current dose at dermatology.   We talked about DBS treatment and I explained the candidacy for this to  the patient and his family.   His brain MRI without contrast from November 2024 did not show any acute findings or any structural changes.   He had a brain DaTscan on 07/05/2023, which showed asymmetric decreased radiotracer activity in the posterior left putamen.  We increased his levodopa to 1 pill 4 times daily in January 2025.  We will monitor his balance and cognitive function.  He has no new concerns at this time.  He is advised to follow-up routinely in about 3 months, sooner if needed.  He is advised to keep [place] posted in the interim after he sees dermatology.  I answered all their questions today and patient and his wife and daughter (on phone) were in agreement. I spent 30 minutes in total face-to-face time and in reviewing records during pre-charting, more than 50% of which was spent in counseling and coordination of care, reviewing test results, reviewing medications and treatment regimen and/or in discussing or reviewing the diagnosis of primary parkinsonism, the prognosis and treatment options. Pertinent laboratory and imaging test results that were available during this visit with the patient were reviewed by me and considered in my medical decision making (see chart for details).

## 2023-07-31 DIAGNOSIS — L111 Transient acantholytic dermatosis [Grover]: Secondary | ICD-10-CM | POA: Diagnosis not present

## 2023-08-05 ENCOUNTER — Telehealth: Payer: Self-pay | Admitting: Neurology

## 2023-08-05 NOTE — Telephone Encounter (Signed)
 I spoke with the patient. He is wishing for a second opinion on his overall situation from movement disorder specialist. He then gave his daughter Carollee Herter (on [place]) the phone to discus with me further. Per daughter, they want to send referrals to [facility], [facility], and [facility] but will see whoever can get him in the soonest. I informed her to obtain this referral through patient's primary care provider, per Dr Frances Furbish. The daughter verbalized understanding. She did request for [place] to send the patients to each of the facilities however so they have pt's neurology records to review. She did say that she believes [facility] wants all records and referral together in one packet. I alternatively gave option for [place] to print and release records to patient and then he can have his pcp send to whomever is chosen. The daughter prefers for [place] to fax the records to the centers at this time. I told her the request would be sent over to our medical records department. She provided the fax numbers (see below):  [facility] [place] [phone]  [facility] [phone]  [facility] [phone]

## 2023-08-05 NOTE — Telephone Encounter (Signed)
 Pt 's daughter, Gwinda Passe my father is requesting a referra for a second opinion for Parkinson's. Would like a call back to my father phone number [phone]

## 2023-08-07 DIAGNOSIS — G20C Parkinsonism, unspecified: Secondary | ICD-10-CM | POA: Diagnosis not present

## 2023-08-07 HISTORY — DX: Parkinsonism, unspecified: G20.C

## 2023-08-07 NOTE — Telephone Encounter (Signed)
 [facility] [place] Ladona Ridgel) would like a call to discuss the reason [facility] could not do a referral for movement disorder. [phone]

## 2023-08-07 NOTE — Telephone Encounter (Signed)
 I called pt's PCP back and LVM for [facility] asking for call back.

## 2023-08-08 ENCOUNTER — Encounter: Payer: Self-pay | Admitting: Neurology

## 2023-08-08 NOTE — Telephone Encounter (Signed)
 Tried to return Taylor's call a second time. LVM for [facility] [place].

## 2023-08-13 DIAGNOSIS — L814 Other melanin hyperpigmentation: Secondary | ICD-10-CM | POA: Diagnosis not present

## 2023-08-13 DIAGNOSIS — D225 Melanocytic nevi of trunk: Secondary | ICD-10-CM | POA: Diagnosis not present

## 2023-08-13 DIAGNOSIS — L821 Other seborrheic keratosis: Secondary | ICD-10-CM | POA: Diagnosis not present

## 2023-08-13 DIAGNOSIS — L578 Other skin changes due to chronic exposure to nonionizing radiation: Secondary | ICD-10-CM | POA: Diagnosis not present

## 2023-08-20 NOTE — Progress Notes (Unsigned)
 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  However, there was very little on my examination that made me think that this would be the diagnosis, with the exception of what was written in his chart previously regarding the behaviors in the past, which we did talk about frankly today.  We talked about the fact that clinically, patients can present very similarly and one would really need to do PETscan to help differentiate FTD and AD behaviors, if present at all.  He was very willing to go ahead and do PET.    -I did discuss with the patient that neurocognitive testing could be valuable.  -DaTscan done in February, 2025 with decreased radiotracer activity in the left putamen.  This is consistent with what is seen on clinical exam  -For now, he will continue the levodopa as prescribed.  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  -Patient asked about surgical interventions.  We preliminarily discussed to those, but would be early for this discussion.  -Discussed skin biopsies for alpha-synuclein, but not sure that it really adds much to his case.  -I will see him back after the above testing has been completed.  -Patient has appointment at [facility] for follow-up Oct 16, 2023.  -Patient has a new patient appointment with [facility] neurology movement disorders January 01, 2024.   -I am not sure but the patient needs 3 different movement disorder neurologist at this point in time, but we can certainly discuss this going forward if he chooses to stay here at our office.  I do plan to see him back in follow-up after our testing is completed.    Subjective:   William Lindsey was seen  today in the movement disorders clinic for neurologic consultation at the request of Fredrich Romans, FNP.  The consultation is for second opinion regarding Parkinson's disease.  Medical records made available to me are reviewed.  Patient has previously seen Dr. Frances Furbish.  Patient first saw Dr. Frances Furbish November, 2023 with complaints of bilateral upper extremity tremor which was longstanding at 20 years, but knew where right upper extremity tremor for 1 year prior to seeing her.  At that visit, she felt that the patient had longstanding essential tremor, but also noted some mild right-sided parkinsonism.  DaTscan was ordered.  This actually was not completed until February, 2025 and demonstrated decreased radiotracer activity in the left putamen.  He was given propranolol 20 mg 3 times daily in November, 2023.  He was then not seen for about a year and then followed up in October, 2024.  He reported that propranolol did not help, and it was discontinued.  Dr. Frances Furbish noted that she was favoring a diagnosis of Parkinsons disease.  MRI was recommended.  This was completed through their office in November, 2024 and was unremarkable.  Family did note significant behavioral changes.  Patient was getting scammed online.  Patient was told to follow-up with primary care for further work up.  They did call not long after the visit with Dr. Frances Furbish did note tremor was increasing off propranolol and wanted to restart it.  She did restart it, but they called back a few days later stating that it did not  help significantly.  Levodopa was then started in November, 2024 over the telephone and propranolol was discontinued.  Patient was last seen by Dr. Frances Furbish July 30, 2023.  He felt he still had the same amount of tremor with levodopa.  He also felt he had a rash on his stomach.  He did follow-up with dermatology and was told that the rash was unrelated to levodopa and it was okay to continue that.   Specific Symptoms:  Tremor: Yes.  ,  he thought that "shaking" was nerves x 20 years but then started in the RUE.  None in the RLE.  He does have trouble writing and eating but also present at rest Family hx of similar:  No. Voice: no change Sleep: gets up to use RR - able to get back to sleep  Vivid Dreams:  No.  Acting out dreams:  has in past but not much lately; never fallen OOB Wet Pillows: No. Postural symptoms:  No.  Falls?  No. Bradykinesia symptoms: no bradykinesia noted Loss of smell:  No. Loss of taste:  No. Urinary Incontinence:  No. Difficulty Swallowing:  No. Handwriting, micrographia: No. Trouble with ADL's:  No.  Trouble buttoning clothing: No. Depression:  No.; wife notes no personality changes -he does admit, when questioned, about getting caught in a scam online with 3 different women.  He has blocked all of them and canceled the accounts, but states that they continue to contact him. Memory changes:  No. Hallucinations:  No.  visual distortions: No. N/V:  No. Lightheaded:  No.  Syncope: No. Diplopia:  No. Dyskinesia:  No. Prior exposure to reglan/antipsychotics: No.    ALLERGIES:   Allergies  Allergen Reactions   Lisinopril Other (See Comments)    Or tickle in throat    CURRENT MEDICATIONS:  Current Meds  Medication Sig   atorvastatin (LIPITOR) 20 MG tablet Take 20 mg by mouth daily.   carbidopa-levodopa (SINEMET IR) 25-100 MG tablet Take 1 tablet by mouth 4 (four) times daily. At 10 AM, 2 PM, 6 PM and 10 PM daily (Patient taking differently: Take 1.5 tablets by mouth 4 (four) times daily. At 10 AM, 2 PM, 6 PM and 10 PM daily)   losartan (COZAAR) 50 MG tablet Take 1 tablet daily for blood pressure control.   Multiple Vitamins-Minerals (PRESERVISION AREDS PO) Take by mouth. Focal     Objective:   VITALS:   Vitals:   08/21/23 0841  BP: 132/84  Pulse: 75  SpO2: 96%  Weight: 175 lb 3.2 oz (79.5 kg)  Height: 5\' 9"  (1.753 m)    GEN:  The patient appears stated age and is in  NAD. HEENT:  Normocephalic, atraumatic.  The mucous membranes are moist. The superficial temporal arteries are without ropiness or tenderness. CV:  RRR Lungs:  CTAB Neck/HEME:  There are no carotid bruits bilaterally.  Neurological examination:  Orientation: The patient is alert and oriented x3.  Cranial nerves: There is good facial symmetry. Extraocular muscles are intact. The visual fields are full to confrontational testing. The speech is fluent and clear. Soft palate rises symmetrically and there is no tongue deviation. Hearing is intact to conversational tone. Sensation: Sensation is intact to light and pinprick throughout (facial, trunk, extremities). Vibration is intact at the bilateral big toe. There is no extinction with double simultaneous stimulation. There is no sensory dermatomal level identified. Motor: Strength is 5/5 in the bilateral upper and lower extremities.   Shoulder shrug is equal and symmetric.  There is no pronator drift. Deep tendon reflexes: Deep tendon reflexes are 2/4 at the bilateral biceps, triceps, brachioradialis, patella and achilles. Plantar responses are downgoing bilaterally.  Pt did take carbidopa/levodopa just prior to examination:    Movement examination: Tone: There is mild increased tone in the RUE/RLE Abnormal movements: there is RUE rest tremor, mild to mod near constant.  There is rare LUE rest tremor.  He has mild postural and intention tremor bilaterally. Coordination:  There is no decremation with RAM's, with any form of RAMS, including alternating supination and pronation of the forearm, hand opening and closing, finger taps, heel taps and toe taps.  Gait and Station: The patient has no difficulty arising out of a deep-seated chair without the use of the hands. The patient's stride length is good with good arm swing bilaterally.  The patient has a negative pull test.     I have reviewed and interpreted the following labs independently   Chemistry       Component Value Date/Time   NA 140 06/18/2023 1044   K 4.8 06/18/2023 1044   CL 104 06/18/2023 1044   CO2 23 06/18/2023 1044   BUN 21 06/18/2023 1044   CREATININE 1.40 (H) 06/18/2023 1044      Component Value Date/Time   CALCIUM 8.8 06/18/2023 1044   ALKPHOS 58 06/18/2023 1044   AST 12 06/18/2023 1044   ALT 5 06/18/2023 1044   BILITOT 0.5 06/18/2023 1044      Lab Results  Component Value Date   TSH 1.650 04/11/2022   Lab Results  Component Value Date   WBC 6.3 01/19/2016   HGB 14.5 01/19/2016   HCT 42.4 01/19/2016   MCV 89.3 01/19/2016   PLT 250 01/19/2016     Total time spent on today's visit was 71 minutes, including both face-to-face time and nonface-to-face time.  Time included that spent on review of records (prior notes available to me/labs/imaging if pertinent), discussing treatment and goals, answering patient's questions and coordinating care.  Cc:  Barbie Banner, MD

## 2023-08-21 ENCOUNTER — Ambulatory Visit: Admitting: Neurology

## 2023-08-21 ENCOUNTER — Telehealth: Payer: Self-pay

## 2023-08-21 ENCOUNTER — Encounter: Payer: Self-pay | Admitting: Neurology

## 2023-08-21 VITALS — BP 132/84 | HR 75 | Ht 69.0 in | Wt 175.2 lb

## 2023-08-21 DIAGNOSIS — F0280 Dementia in other diseases classified elsewhere without behavioral disturbance: Secondary | ICD-10-CM

## 2023-08-21 DIAGNOSIS — R413 Other amnesia: Secondary | ICD-10-CM | POA: Diagnosis not present

## 2023-08-21 DIAGNOSIS — G20C Parkinsonism, unspecified: Secondary | ICD-10-CM | POA: Diagnosis not present

## 2023-08-21 DIAGNOSIS — G3109 Other frontotemporal dementia: Secondary | ICD-10-CM

## 2023-08-21 NOTE — Telephone Encounter (Signed)
 Patient PA denied sent clinicals today to [facility]  Fax # [phone] Ref # [id number]

## 2023-08-21 NOTE — Patient Instructions (Signed)
 You have been referred for a neurocognitive evaluation (i.e., evaluation of memory and thinking abilities). Please bring someone with you to this appointment if possible, as it is helpful for the neuropsychologist to hear from both you and another adult who knows you well. Please bring eyeglasses and hearing aids if you wear them and take any medications as you normally would. Please fully abstain from all alcohol, marijuana, or other substances prior to your appointment.   The evaluation will take approximately 2-3 hours and has two parts:   The first part is a clinical interview with the neuropsychologist, Dr. Milbert Coulter or Dr. Robbie Lis. During the interview, the neuropsychologist will speak with you and the individual you brought to the appointment.    The second part of the evaluation is testing with the doctor's technician, aka psychometrician, Annabelle Harman or [employer]. During the testing, the technician will ask you to remember different types of material, solve problems, and answer some questionnaires. Your family member will not be present for this portion of the evaluation.   Please note: We have to reserve several hours of the neuropsychologist's time and the psychometrician's time for your evaluation appointment. As such, there is a No-Show fee of $100. If you are unable to attend any of your appointments, please contact our office as soon as possible to reschedule.

## 2023-08-26 ENCOUNTER — Encounter: Payer: Self-pay | Admitting: Psychology

## 2023-08-26 DIAGNOSIS — L821 Other seborrheic keratosis: Secondary | ICD-10-CM | POA: Insufficient documentation

## 2023-08-26 DIAGNOSIS — L814 Other melanin hyperpigmentation: Secondary | ICD-10-CM | POA: Insufficient documentation

## 2023-08-26 DIAGNOSIS — N400 Enlarged prostate without lower urinary tract symptoms: Secondary | ICD-10-CM | POA: Insufficient documentation

## 2023-08-26 DIAGNOSIS — L111 Transient acantholytic dermatosis [Grover]: Secondary | ICD-10-CM | POA: Insufficient documentation

## 2023-08-26 DIAGNOSIS — D1801 Hemangioma of skin and subcutaneous tissue: Secondary | ICD-10-CM | POA: Insufficient documentation

## 2023-08-26 DIAGNOSIS — E785 Hyperlipidemia, unspecified: Secondary | ICD-10-CM | POA: Insufficient documentation

## 2023-08-27 ENCOUNTER — Encounter: Payer: Self-pay | Admitting: Psychology

## 2023-08-27 ENCOUNTER — Telehealth: Payer: Self-pay | Admitting: Neurology

## 2023-08-27 ENCOUNTER — Ambulatory Visit: Payer: Self-pay | Admitting: Psychology

## 2023-08-27 DIAGNOSIS — G20C Parkinsonism, unspecified: Secondary | ICD-10-CM

## 2023-08-27 DIAGNOSIS — R4189 Other symptoms and signs involving cognitive functions and awareness: Secondary | ICD-10-CM

## 2023-08-27 DIAGNOSIS — G3184 Mild cognitive impairment, so stated: Secondary | ICD-10-CM | POA: Diagnosis not present

## 2023-08-27 HISTORY — DX: Mild cognitive impairment of uncertain or unknown etiology: G31.84

## 2023-08-27 NOTE — Telephone Encounter (Signed)
 I did peer to peer prior authorization for PET scan.  PET scan has been approved with authorization [id number] and it is good from August 21, 2023 until Sep 19, 2023

## 2023-08-27 NOTE — Progress Notes (Signed)
   Psychometrician Note   Cognitive testing was administered to William Lindsey by Wallace Keller, B.S. (psychometrist) under the supervision of Dr. Newman Nickels, Ph.D., ABPP, licensed psychologist on 08/27/2023. William Lindsey did not appear overtly distressed by the testing session per behavioral observation or responses across self-report questionnaires. Rest breaks were offered.    The battery of tests administered was selected by Dr. Newman Nickels, Ph.D., ABPP with consideration to William Lindsey current level of functioning, the nature of his symptoms, emotional and behavioral responses during interview, level of literacy, observed level of motivation/effort, and the nature of the referral question. This battery was communicated to the psychometrist. Communication between Dr. Newman Nickels, Ph.D., ABPP and the psychometrist was ongoing throughout the evaluation and Dr. Newman Nickels, Ph.D., ABPP was immediately accessible at all times. Dr. Newman Nickels, Ph.D., ABPP provided supervision to the psychometrist on the date of this service to the extent necessary to assure the quality of all services provided.    William Lindsey will return within approximately 1-2 weeks for an interactive feedback session with Dr. Milbert Coulter at which time his test performances, clinical impressions, and treatment recommendations will be reviewed in detail. William Lindsey understands he can contact our office should he require our assistance before this time.  A total of 140 minutes of billable time were spent face-to-face with William Lindsey by the psychometrist. This includes both test administration and scoring time. Billing for these services is reflected in the clinical report generated by Dr. Newman Nickels, Ph.D., ABPP  This note reflects time spent with the psychometrician and does not include test scores or any clinical interpretations made by Dr. Milbert Coulter. The full report will follow in a separate note.

## 2023-08-27 NOTE — Progress Notes (Unsigned)
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 Percentile   Hooper VOT: 24/30 --- Very Low Probability  of Impairment       NAB Spatial Module, Form 1: T Score Percentile     Visual Discrimination 38 12 Below Average        Scaled Score Percentile   WAIS-5 Visual Puzzles: 11 63 Average       Mood and Personality:      Raw Score Percentile   PROMIS Depression Questionnaire: 10 --- None to Slight  PROMIS Anxiety Questionnaire: 10 --- None to Slight       Additional Questionnaires:      Raw Score Percentile   PROMIS Sleep Disturbance Questionnaire: 12 --- None to Slight   Informed Consent and Coding/Compliance:   The current evaluation represents a clinical evaluation for the purposes previously outlined by the referral source and is in no way reflective of a forensic evaluation.   Mr. Madani was provided with a verbal description of the nature and purpose of the present neuropsychological evaluation. Also reviewed were the foreseeable risks and/or discomforts and benefits of the procedure, limits of confidentiality, and  mandatory reporting requirements of this provider. The patient was given the opportunity to ask questions and receive answers about the evaluation. Oral consent to participate was provided by the patient.   This evaluation was conducted by Newman Nickels, Ph.D., ABPP-CN, board certified clinical neuropsychologist. Mr. Gencarelli completed a clinical interview with Dr. Milbert Coulter, billed as one unit [phone], and 140 minutes of cognitive testing and scoring, billed as one unit [phone] and four additional units [id number]. Psychometrist Wallace Keller, B.S. assisted Dr. Milbert Coulter with test administration and scoring procedures. As a separate and discrete service, one unit [id number] and two units [phone] were billed for Dr. Tammy Sours time spent in interpretation and report writing.

## 2023-08-28 ENCOUNTER — Encounter: Payer: Self-pay | Admitting: Psychology

## 2023-08-28 NOTE — Telephone Encounter (Signed)
 I have sent Pet Scan to scheduling and they will be calling patient today to get him scheduled;ed

## 2023-09-04 ENCOUNTER — Ambulatory Visit: Payer: Self-pay | Admitting: Psychology

## 2023-09-04 DIAGNOSIS — G3184 Mild cognitive impairment, so stated: Secondary | ICD-10-CM | POA: Diagnosis not present

## 2023-09-04 DIAGNOSIS — G20C Parkinsonism, unspecified: Secondary | ICD-10-CM | POA: Diagnosis not present

## 2023-09-04 NOTE — Progress Notes (Signed)
   Neuropsychology Feedback Session William Lindsey. [facility] Department of Neurology  Reason for Referral:   [name] is a 78 y.o. right-handed Caucasian male referred by Fran Imus, D.O., to characterize his current cognitive functioning and assist with diagnostic clarity and treatment planning in the context of concern for underlying Parkinson's disease and potential cognitive deterioration over time.   Feedback:   [name] completed a comprehensive neuropsychological evaluation on 08/27/2023. Please refer to that encounter for the full report and recommendations. Briefly, results suggested performance variability across executive functioning, semantic fluency, and delayed retrieval aspects of memory. In each case, [name] exhibited a single low performance surrounded by other appropriate performances, thus not suggestive of any consistent cognitive impairment. The cause for ongoing variability remains somewhat uncertain. Two prior neurologists have suggested underlying Parkinson's disease being the most likely etiology given prominent resting tremors and other physical signs. His prior DaTscan was also suggestive of an underlying parkinsonian presentation. He does not report fully formed hallucinations, REM sleep behaviors, frequent falling, oculomotor gaze palsy, myoclonus, or alien-limb experiences, making other non-Parkinson's disease presentations (e.g., Lewy body disease, progressive supranuclear palsy, corticobasal degeneration) which would also yield an abnormal DaTscan scan unlikely. Cognitive testing is somewhat unexpected in that [name] performed well across the most common cognitive domains impacted by a parkinsonian presentation (i.e., processing speed, attention/concentration, visuospatial abilities). However, deficits with executive functioning/cognitive flexibility and rapid word retrieval can certainly be seen in this presentation. Overall, current testing  certainly does not rule out underlying Parkinson's disease and this remains the most likely neurological culprit given currently available information (absent of any atypical patterns which may arise from his upcoming PET scan) in my opinion.   [name] was accompanied by his wife during the current feedback session. Content of the current session focused on the results of his neuropsychological evaluation. [name] was given the opportunity to ask questions and his questions were answered. He was encouraged to reach out should additional questions arise. A copy of his report was provided at the conclusion of the visit.      One unit [phone] was billed for Dr. Arsenio Larger time spent preparing for, conducting, and documenting the current feedback session with [name].

## 2023-09-13 ENCOUNTER — Telehealth: Payer: Self-pay | Admitting: Neurology

## 2023-09-13 NOTE — Telephone Encounter (Signed)
 Pt called in and left a message. He is wanting to get a prescription sent in for his carbidopa -levodopa . He has enough to last 1 more day. [facility] is his pharmacy.

## 2023-09-13 NOTE — Telephone Encounter (Signed)
 Patient called in to get a refill on his carbi/levo 25-100mg . He said he was going to [facility] but seeing tat now. I do see he was seen for a 2nd opinion with Tat. He said he wants to see Tat from now on. Is this okay to schedule a f/u appt and get a refill or does he need to return to [facility]. He would like to speak someone about this.  He doesn't want to return to [facility]

## 2023-09-13 NOTE — Telephone Encounter (Signed)
 Called again for same below issue

## 2023-09-16 ENCOUNTER — Other Ambulatory Visit: Payer: Self-pay

## 2023-09-16 ENCOUNTER — Telehealth: Payer: Self-pay | Admitting: Neurology

## 2023-09-16 DIAGNOSIS — G20C Parkinsonism, unspecified: Secondary | ICD-10-CM

## 2023-09-16 NOTE — Telephone Encounter (Signed)
 Patient needs a refill on the Carbidopa  levodopa   We changed the dosage so they need a updated rx he states that he is taking 4 pills a day  and wants this called into the [facility] in [place]   He only has 2 pills left

## 2023-09-16 NOTE — Telephone Encounter (Signed)
 Pt is sch for 10-31-23 with Dr Tat at 3:00 and patient is aware

## 2023-09-17 ENCOUNTER — Other Ambulatory Visit: Payer: Self-pay

## 2023-09-17 ENCOUNTER — Telehealth: Payer: Self-pay | Admitting: Neurology

## 2023-09-17 DIAGNOSIS — G20C Parkinsonism, unspecified: Secondary | ICD-10-CM

## 2023-09-17 MED ORDER — CARBIDOPA-LEVODOPA 25-100 MG PO TABS: ORAL_TABLET | ORAL | 0 refills | Status: DC

## 2023-09-17 NOTE — Telephone Encounter (Signed)
 Called [facility] and with a discount card they are able to fill patients meds for $7.34

## 2023-09-17 NOTE — Telephone Encounter (Signed)
 Sent in RX and called [facility]

## 2023-09-17 NOTE — Telephone Encounter (Signed)
 Pt left a message on the VM stating that he need to speak with Joie Narrow about a RX  ( did not leave the name of the medication ) he states that there is  a problem with what was called in

## 2023-09-19 ENCOUNTER — Telehealth: Payer: Self-pay

## 2023-09-19 NOTE — Telephone Encounter (Signed)
 Auth # [id number] Valid dates 09-19-23 - 10-18-23

## 2023-09-20 ENCOUNTER — Encounter (HOSPITAL_COMMUNITY)
Admission: RE | Admit: 2023-09-20 | Discharge: 2023-09-20 | Disposition: A | Discharge status: Home / Self Care | Source: Ambulatory Visit | Attending: Neurology | Admitting: Neurology

## 2023-09-20 DIAGNOSIS — G3109 Other frontotemporal dementia: Secondary | ICD-10-CM | POA: Insufficient documentation

## 2023-09-20 DIAGNOSIS — F0280 Dementia in other diseases classified elsewhere without behavioral disturbance: Secondary | ICD-10-CM | POA: Diagnosis not present

## 2023-09-20 DIAGNOSIS — F0390 Unspecified dementia without behavioral disturbance: Secondary | ICD-10-CM | POA: Diagnosis not present

## 2023-09-20 LAB — GLUCOSE, CAPILLARY: Glucose-Capillary: 106 mg/dL — ABNORMAL HIGH (ref 70–99)

## 2023-09-20 MED ORDER — FLUDEOXYGLUCOSE F - 18 (FDG) INJECTION
10.0100 | Freq: Once | INTRAVENOUS | Status: AC
  Administered 2023-09-20: 10.01 via INTRAVENOUS

## 2023-09-23 ENCOUNTER — Encounter: Payer: Self-pay | Admitting: Neurology

## 2023-10-16 ENCOUNTER — Ambulatory Visit: Payer: Medicare Other | Admitting: Neurology

## 2023-10-30 NOTE — Progress Notes (Signed)
 Assessment/Plan:   1.  ET/PD  -He will continue carbidopa /levodopa  25/100, 1 tablet at 6am/10 AM/2 PM/6 PM  -tremor very bothersome.  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  Discussed what that means.  He is still a bit rigid and after a lot of discussion, we decided to cautiously add pramipexole and work to 0.5 mg tid.  R/b/se, including risks of compulsive behaviors/sleep attacks discussed and they would like to trial.  He was given a 1 month titration schedule.  2.  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  My previous records were reviewed prior to todays visit as well as outside records available to me.  Patient has done well since our last visit.  He continues to take his levodopa .  He has had no falls.  He is golfing for exercise.  No near syncope.  He had neurocognitive testing with Dr. Arsenio Larger last visit demonstrating mild cognitive impairment.  PET scan done in May, 2025 was normal.  Current prescribed movement disorder medications: Carbidopa /levodopa  25/100, 1 tablet at 6:30/10:30/2:30/6:30   ALLERGIES:   Allergies  Allergen Reactions   Lisinopril Other (See Comments)    Or tickle in throat    CURRENT MEDICATIONS:  Current Meds  Medication Sig   atorvastatin (LIPITOR) 20 MG tablet Take 20 mg by mouth daily.   carbidopa -levodopa  (SINEMET  IR) 25-100 MG tablet Take 1 tablet At 10 AM, 2 PM, 6 PM and 10 PM daily   losartan (COZAAR) 50 MG tablet Take 1 tablet daily for blood pressure control.   Multiple Vitamins-Minerals (PRESERVISION AREDS PO) Take by mouth. Focal   pramipexole (MIRAPEX) 0.125 MG tablet 1 po tid x 1 week, then 2 po tid x 1 week, then 3 po tid   pramipexole (MIRAPEX) 0.5 MG tablet Take 1 tablet (0.5 mg total) by mouth 3 (three) times daily.     Objective:   PHYSICAL EXAMINATION:     VITALS:   Vitals:   10/31/23 1449  BP: 122/80  Pulse: 74  SpO2: 98%  Weight: 171 lb 12.8 oz (77.9 kg)  Height: 5' 9 (1.753 m)    GEN:  The patient appears stated age and is in NAD. HEENT:  Normocephalic, atraumatic.  The mucous membranes are moist. The superficial temporal arteries are without ropiness or tenderness. CV:  RRR Lungs:  CTAB Neck/HEME:  There are no carotid bruits bilaterally.  Neurological examination:  Orientation: The patient is alert and oriented x3. Cranial nerves: There is good facial symmetry with no significant facial hypomimia. The speech is fluent and clear. Soft palate rises symmetrically and there is no tongue deviation. Hearing is intact to conversational tone. Sensation: Sensation is intact to light touch throughout Motor: Strength is at least antigravity x4.  Movement examination: Tone: There is mild to mod increased tone in the RUE/RLE Abnormal movements: there is RUE rest tremor, mild to mod near constant.  There is rare LUE rest tremor.  He has mild postural and intention tremor bilaterally. Coordination:  There is no decremation with RAM's, with any form of RAMS, including alternating supination and pronation of the forearm, hand opening and closing, finger taps, heel taps and toe taps.  Gait and Station: The patient has no difficulty arising out of a deep-seated chair without the use of the hands.  The patient's stride length is good with good arm swing bilaterally.  The patient has a negative pull test.    I have reviewed and interpreted the following labs independently    Chemistry      Component Value Date/Time   NA 140 06/18/2023 1044   K 4.8 06/18/2023 1044   CL 104 06/18/2023 1044   CO2 23 06/18/2023 1044   BUN 21 06/18/2023 1044   CREATININE 1.40 (H) 06/18/2023 1044      Component Value Date/Time   CALCIUM 8.8 06/18/2023 1044   ALKPHOS 58 06/18/2023 1044   AST 12 06/18/2023 1044   ALT 5 06/18/2023 1044   BILITOT 0.5 06/18/2023  1044       Lab Results  Component Value Date   WBC 6.3 01/19/2016   HGB 14.5 01/19/2016   HCT 42.4 01/19/2016   MCV 89.3 01/19/2016   PLT 250 01/19/2016    Lab Results  Component Value Date   TSH 1.650 04/11/2022     Total time spent on today's visit was 40 minutes, including both face-to-face time and nonface-to-face time.  Time included that spent on review of records (prior notes available to me/labs/imaging if pertinent), discussing treatment and goals, answering patient's questions and coordinating care.  Cc:  Cynda Drafts, FNP

## 2023-10-31 ENCOUNTER — Ambulatory Visit: Admitting: Neurology

## 2023-10-31 ENCOUNTER — Encounter: Payer: Self-pay | Admitting: Neurology

## 2023-10-31 VITALS — BP 122/80 | HR 74 | Ht 69.0 in | Wt 171.8 lb

## 2023-10-31 DIAGNOSIS — G20A1 Parkinson's disease without dyskinesia, without mention of fluctuations: Secondary | ICD-10-CM

## 2023-10-31 MED ORDER — PRAMIPEXOLE DIHYDROCHLORIDE 0.5 MG PO TABS: 0.5000 mg | ORAL_TABLET | Freq: Three times a day (TID) | ORAL | 1 refills | Status: DC

## 2023-10-31 MED ORDER — PRAMIPEXOLE DIHYDROCHLORIDE 0.125 MG PO TABS: ORAL_TABLET | ORAL | 0 refills | Status: DC

## 2023-10-31 NOTE — Patient Instructions (Addendum)
 Start mirapex (pramipexole) as follows:  0.125 mg - 1 tablet three times per day for a week, then 2 tablets three times per day for a week, then 3 tablets three times per day for a week and then fill the 0.5 mg tablet and take 1 of those three times per day  SAVE THE DATE!  We are planning a Parkinsons Disease educational symposium at [facility] in [place] on September 19.  More details to come!  We will have a movement disorder physician expert from [employer] coming to speak and a caregiver speaker.  We will have a panel of experts that will show you who you may need on your team of people on your journey with Parkinsons.  If you would like to be added to our email list to get further information, email [email or web address].  I hope to see you there!

## 2023-11-02 ENCOUNTER — Encounter: Payer: Self-pay | Admitting: Neurology

## 2023-11-05 ENCOUNTER — Other Ambulatory Visit: Payer: Self-pay | Admitting: Neurology

## 2023-11-05 DIAGNOSIS — G20C Parkinsonism, unspecified: Secondary | ICD-10-CM

## 2023-11-05 MED ORDER — CARBIDOPA-LEVODOPA 25-100 MG PO TABS: ORAL_TABLET | ORAL | Status: DC

## 2023-12-03 ENCOUNTER — Other Ambulatory Visit: Payer: Self-pay | Admitting: Neurology

## 2023-12-03 DIAGNOSIS — G20C Parkinsonism, unspecified: Secondary | ICD-10-CM

## 2023-12-11 DIAGNOSIS — L723 Sebaceous cyst: Secondary | ICD-10-CM | POA: Diagnosis not present

## 2023-12-17 DIAGNOSIS — R350 Frequency of micturition: Secondary | ICD-10-CM | POA: Diagnosis not present

## 2023-12-17 DIAGNOSIS — C61 Malignant neoplasm of prostate: Secondary | ICD-10-CM | POA: Diagnosis not present

## 2023-12-17 DIAGNOSIS — N5201 Erectile dysfunction due to arterial insufficiency: Secondary | ICD-10-CM | POA: Diagnosis not present

## 2024-01-01 DIAGNOSIS — L723 Sebaceous cyst: Secondary | ICD-10-CM | POA: Diagnosis not present

## 2024-01-20 ENCOUNTER — Telehealth: Admitting: Physician Assistant

## 2024-01-20 DIAGNOSIS — I951 Orthostatic hypotension: Secondary | ICD-10-CM | POA: Diagnosis not present

## 2024-01-20 NOTE — Patient Instructions (Signed)
 William Lindsey, thank you for joining [name], PA-C for today's virtual visit.  While this provider is not your primary care provider (PCP), if your PCP is located in our provider database this encounter information will be shared with them immediately following your visit.   A [facility] MyChart account gives you access to today's visit and all your visits, tests, and labs performed at [facility]  click here if you don't have a [facility] MyChart account or go to [email or web address]  Consent: (Patient) William Lindsey provided verbal consent for this virtual visit at the beginning of the encounter.  Current Medications:  Current Outpatient Medications:    atorvastatin (LIPITOR) 20 MG tablet, Take 20 mg by mouth daily., Disp: , Rfl:    carbidopa -levodopa  (SINEMET  IR) 25-100 MG tablet, TAKE 1 TABLET AT 6am/10 AM/2 PM/6 PM, Disp: 360 tablet, Rfl: 0   losartan (COZAAR) 50 MG tablet, Take 1 tablet daily for blood pressure control., Disp: , Rfl:    Multiple Vitamins-Minerals (PRESERVISION AREDS PO), Take by mouth. Focal, Disp: , Rfl:    Medications ordered in this encounter:  No orders of the defined types were placed in this encounter.    *If you need refills on other medications prior to your next appointment, please contact your pharmacy*  Follow-Up: Call back or seek an in-person evaluation if the symptoms worsen or if the condition fails to improve as anticipated.   Virtual Care [phone]  Other Instructions Orthostatic Hypotension Blood pressure is a measurement of how strongly, or weakly, your circulating blood is pressing against the walls of your arteries. Orthostatic hypotension is a drop in blood pressure that can happen when you change positions, such as when you go from lying down to standing. Arteries are blood vessels that carry blood from your heart throughout your body. When blood pressure is too low, you may not get enough  blood to your brain or to the rest of your organs. Orthostatic hypotension can cause light-headedness, sweating, rapid heartbeat, blurred vision, and fainting. These symptoms require further investigation into the cause. What are the causes? Orthostatic hypotension can be caused by many things, including: Sudden changes in posture, such as standing up quickly after you have been sitting or lying down. Loss of blood (anemia) or loss of body fluids (dehydration). Heart problems, neurologic problems, or hormone problems. Pregnancy. Aging. The risk for this condition increases as you get older. Severe infection (sepsis). Certain medicines, such as medicines for high blood pressure or medicines that make the body lose excess fluids (diuretics). What are the signs or symptoms? Symptoms of this condition may include: Weakness, light-headedness, or dizziness. Sweating. Blurred vision. Tiredness (fatigue). Rapid heartbeat. Fainting, in severe cases. How is this diagnosed? This condition is diagnosed based on: Your symptoms and medical history. Your blood pressure measurements. Your health care provider will check your blood pressure when you are: Lying down. Sitting. Standing. A blood pressure reading is recorded as two numbers, such as 120 over 80 (or 120/80). The first (top) number is called the systolic pressure. It is a measure of the pressure in your arteries as your heart beats. The second (bottom) number is called the diastolic pressure. It is a measure of the pressure in your arteries when your heart relaxes between beats. Blood pressure is measured in a unit called mmHg. Healthy blood pressure for most adults is 120/80 mmHg. Orthostatic hypotension is defined as a 20 mmHg drop in systolic pressure or a 10  mmHg drop in diastolic pressure within 3 minutes of standing. Other information or tests that may be used to diagnose orthostatic hypotension include: Your other vital signs, such  as your heart rate and temperature. Blood tests. An electrocardiogram (ECG) or echocardiogram. A Holter monitor. This is a device you wear that records your heart rhythm continuously, usually for 24-48 hours. Tilt table test. For this test, you will be safely secured to a table that moves you from a lying position to an upright position. Your heart rhythm and blood pressure will be monitored during the test. How is this treated? This condition may be treated by: Changing your diet. This may involve eating more salt (sodium) or drinking more water. Changing the dosage of certain medicines you are taking that might be lowering your blood pressure. Correcting the underlying reason for the orthostatic hypotension. Wearing compression stockings. Taking medicines to raise your blood pressure. Avoiding actions that trigger symptoms. Follow these instructions at home: Medicines Take over-the-counter and prescription medicines only as told by your health care provider. Follow instructions from your health care provider about changing the dosage of your current medicines, if this applies. Do not stop or adjust any of your medicines on your own. Eating and drinking  Drink enough fluid to keep your urine pale yellow. Eat extra salt only as directed. Do not add extra salt to your diet unless advised by your health care provider. Eat frequent, small meals. Avoid standing up suddenly after eating. General instructions  Get up slowly from lying down or sitting positions. This gives your blood pressure a chance to adjust. Avoid hot showers and excessive heat as directed by your health care provider. Engage in regular physical activity as directed by your health care provider. If you have compression stockings, wear them as told. Keep all follow-up visits. This is important. Contact a health care provider if: You have a fever for more than 2-3 days. You feel more thirsty than usual. You feel dizzy or  weak. Get help right away if: You have chest pain. You have a fast or irregular heartbeat. You become sweaty or feel light-headed. You feel short of breath. You faint. You have any symptoms of a stroke. BE FAST is an easy way to remember the main warning signs of a stroke: B - Balance. Signs are dizziness, sudden trouble walking, or loss of balance. E - Eyes. Signs are trouble seeing or a sudden change in vision. F - Face. Signs are sudden weakness or numbness of the face, or the face or eyelid drooping on one side. A - Arms. Signs are weakness or numbness in an arm. This happens suddenly and usually on one side of the body. S - Speech. Signs are sudden trouble speaking, slurred speech, or trouble understanding what people say. T - Time. Time to call emergency services. Write down what time symptoms started. You have other signs of a stroke, such as: A sudden, severe headache with no known cause. Nausea or vomiting. Seizure. These symptoms may represent a serious problem that is an emergency. Do not wait to see if the symptoms will go away. Get medical help right away. Call your local emergency services (911 in the [place] Do not drive yourself to the hospital. Summary Orthostatic hypotension is a sudden drop in blood pressure. It can cause light-headedness, sweating, rapid heartbeat, blurred vision, and fainting. Orthostatic hypotension can be diagnosed by having your blood pressure taken while lying down, sitting, and then standing. Treatment may involve changing  your diet, wearing compression stockings, sitting up slowly, adjusting your medicines, or correcting the underlying reason for the orthostatic hypotension. Get help right away if you have chest pain, a fast or irregular heartbeat, or symptoms of a stroke. This information is not intended to replace advice given to you by your health care provider. Make sure you discuss any questions you have with your health care  provider. Document Revised: 07/21/2020 Document Reviewed: 07/21/2020 Elsevier Patient Education  2024 Elsevier Inc.   If you have been instructed to have an in-person evaluation today at a local Urgent Care facility, please use the link below. It will take you to a list of all of our available [facility], including address, phone number and hours of operation. Please do not delay care.  [facility]  If you or a family member do not have a primary care provider, use the link below to schedule a visit and establish care. When you choose a [facility] primary care physician or advanced practice provider, you gain a long-term partner in health. Find a Primary Care Provider  Learn more about [facility]'s in-office and virtual care options: [facility] - Get Care Now

## 2024-01-20 NOTE — Progress Notes (Signed)
 Virtual Visit Consent   [name], you are scheduled for a virtual visit with a [name] provider today. Just as with appointments in the office, your consent must be obtained to participate. Your consent will be active for this visit and any virtual visit you may have with one of our providers in the next 365 days. If you have a MyChart account, a copy of this consent can be sent to you electronically.  As this is a virtual visit, video technology does not allow for your provider to perform a traditional examination. This may limit your provider's ability to fully assess your condition. If your provider identifies any concerns that need to be evaluated in person or the need to arrange testing (such as labs, EKG, etc.), we will make arrangements to do so. Although advances in technology are sophisticated, we cannot ensure that it will always work on either your end or our end. If the connection with a video visit is poor, the visit may have to be switched to a telephone visit. With either a video or telephone visit, we are not always able to ensure that we have a secure connection.  By engaging in this virtual visit, you consent to the provision of healthcare and authorize for your insurance to be billed (if applicable) for the services provided during this visit. Depending on your insurance coverage, you may receive a charge related to this service.  I need to obtain your verbal consent now. Are you willing to proceed with your visit today? William Lindsey has provided verbal consent on 01/20/2024 for a virtual visit (video or telephone). William CHRISTELLA Dickinson, PA-C  Date: 01/20/2024 9:33 AM   Virtual Visit via Video Note   I, William Lindsey, connected with  William Lindsey  ([id number], November 18, 1945) on 01/20/24 at  8:45 AM EDT by a video-enabled telemedicine application and verified that I am speaking with the correct person using two identifiers.  Location: Patient: Virtual Visit Location  Patient: Home Provider: Virtual Visit Location Provider: Home Office   I discussed the limitations of evaluation and management by telemedicine and the availability of in person appointments. The patient expressed understanding and agreed to proceed.    History of Present Illness: William Lindsey is a 78 y.o. who identifies as a male who was assigned male at birth, and is being seen today for dizziness.  HPI: Dizziness This is a new problem. The current episode started yesterday. The problem occurs intermittently. The problem has been unchanged. Pertinent negatives include no anorexia, chest pain, chills, congestion, coughing, diaphoresis, fever, headaches, nausea, numbness, vertigo, visual change or weakness. The symptoms are aggravated by walking and standing.   Feels off balance only from sitting to standing and with walking some. The longer he stands the symptoms improve. Symptom of feeling off balance only last for a minute or less with change of position. Going from laying down to sitting and turning head while sitting do not cause the symptoms.  PMH: Parkinson's, HTN, CKD  Problems:  Patient Active Problem List   Diagnosis Date Noted   Mild cognitive impairment of uncertain or unknown etiology 08/27/2023   Grover's disease    Hemangioma of skin and subcutaneous tissue    Lentigo    Other seborrheic keratosis    BPH (benign prostatic hyperplasia)    Hyperlipidemia    Primary parkinsonism 08/07/2023   Tremors of nervous system 03/01/2021   Stage 3a chronic kidney disease 03/06/2020   Diastasis recti  05/19/2019   Essential hypertension 04/05/2016   Erectile dysfunction 10/04/2015   Pure hypercholesterolemia 10/04/2015   Prostate cancer 2013    Allergies:  Allergies  Allergen Reactions   Lisinopril Other (See Comments)    Or tickle in throat   Medications:  Current Outpatient Medications:    atorvastatin (LIPITOR) 20 MG tablet, Take 20 mg by mouth daily., Disp: , Rfl:     carbidopa -levodopa  (SINEMET  IR) 25-100 MG tablet, TAKE 1 TABLET AT 6am/10 AM/2 PM/6 PM, Disp: 360 tablet, Rfl: 0   losartan (COZAAR) 50 MG tablet, Take 1 tablet daily for blood pressure control., Disp: , Rfl:    Multiple Vitamins-Minerals (PRESERVISION AREDS PO), Take by mouth. Focal, Disp: , Rfl:   Observations/Objective: Patient is well-developed, well-nourished in no acute distress.  Resting comfortably at home.  Head is normocephalic, atraumatic.  No labored breathing.  Speech is clear and coherent with logical content.  Patient is alert and oriented at baseline.    Assessment and Plan: 1. Orthostatic hypotension (Primary)  - Suspect orthostatic hypotension - Advised to push fluids today - Change positions slowly - Elevate legs when at rest - Compression stockings can help - Advised if symptoms continue to seek in person evaluation as he may need Orthostatic BP checked, consider Losartan or Levodopa  as potential medication triggers (Did recently have Levodopa  increased to 6 tablets daily) - Seek in person evaluation if not improving or if symptoms worsen  Follow Up Instructions: I discussed the assessment and treatment plan with the patient. The patient was provided an opportunity to ask questions and all were answered. The patient agreed with the plan and demonstrated an understanding of the instructions.  A copy of instructions were sent to the patient via MyChart unless otherwise noted below.    The patient was advised to call back or seek an in-person evaluation if the symptoms worsen or if the condition fails to improve as anticipated.    William CHRISTELLA Dickinson, PA-C

## 2024-02-05 ENCOUNTER — Ambulatory Visit: Admitting: Neurology

## 2024-02-08 ENCOUNTER — Emergency Department (HOSPITAL_BASED_OUTPATIENT_CLINIC_OR_DEPARTMENT_OTHER)
Admission: EM | Admit: 2024-02-08 | Discharge: 2024-02-08 | Disposition: A | Discharge status: Home / Self Care | Attending: Emergency Medicine | Admitting: Emergency Medicine

## 2024-02-08 ENCOUNTER — Encounter (HOSPITAL_BASED_OUTPATIENT_CLINIC_OR_DEPARTMENT_OTHER): Payer: Self-pay

## 2024-02-08 ENCOUNTER — Other Ambulatory Visit: Payer: Self-pay

## 2024-02-08 DIAGNOSIS — G20C Parkinsonism, unspecified: Secondary | ICD-10-CM | POA: Insufficient documentation

## 2024-02-08 DIAGNOSIS — W228XXA Striking against or struck by other objects, initial encounter: Secondary | ICD-10-CM | POA: Insufficient documentation

## 2024-02-08 DIAGNOSIS — Z79899 Other long term (current) drug therapy: Secondary | ICD-10-CM | POA: Diagnosis not present

## 2024-02-08 DIAGNOSIS — N1831 Chronic kidney disease, stage 3a: Secondary | ICD-10-CM | POA: Insufficient documentation

## 2024-02-08 DIAGNOSIS — S61213A Laceration without foreign body of left middle finger without damage to nail, initial encounter: Secondary | ICD-10-CM | POA: Diagnosis not present

## 2024-02-08 DIAGNOSIS — Z8546 Personal history of malignant neoplasm of prostate: Secondary | ICD-10-CM | POA: Diagnosis not present

## 2024-02-08 DIAGNOSIS — Y9301 Activity, walking, marching and hiking: Secondary | ICD-10-CM | POA: Insufficient documentation

## 2024-02-08 DIAGNOSIS — Z23 Encounter for immunization: Secondary | ICD-10-CM | POA: Insufficient documentation

## 2024-02-08 DIAGNOSIS — I129 Hypertensive chronic kidney disease with stage 1 through stage 4 chronic kidney disease, or unspecified chronic kidney disease: Secondary | ICD-10-CM | POA: Insufficient documentation

## 2024-02-08 MED ORDER — TETANUS-DIPHTH-ACELL PERTUSSIS 5-2.5-18.5 LF-MCG/0.5 IM SUSY
0.5000 mL | PREFILLED_SYRINGE | Freq: Once | INTRAMUSCULAR | Status: AC
  Administered 2024-02-08: 0.5 mL via INTRAMUSCULAR
  Filled 2024-02-08: qty 0.5

## 2024-02-08 NOTE — ED Triage Notes (Signed)
 Pt was coming down the steps in his attic when he missed a step and started to fall. Grabbed the rail with his left hand and cut his middle finger, thumb and palm. Small cuts bleeding controlled.

## 2024-02-08 NOTE — ED Provider Notes (Signed)
 [place] EMERGENCY DEPARTMENT AT [facility] Provider Note   CSN: [id number] Arrival date & time: 02/08/24  1502     Patient presents with: [place]   William Lindsey is a 78 y.o. male presents with complaints of multiple superficial lacerations involving the left hand.  Occurred when he was walking down the attic and prevented a fall by grabbing a rail.  Unsure of tetanus status.  Did not fall and hit his head.  No other pertinent pain.    Fall      Past Medical History:  Diagnosis Date   Borderline hypertension    no meds   BPH (benign prostatic hyperplasia)    Diastasis recti 05/19/2019   Erectile dysfunction 10/04/2015   Essential hypertension 04/05/2016   Grover's disease    Hemangioma of skin and subcutaneous tissue    Hyperlipidemia    Lentigo    Mild cognitive impairment of uncertain or unknown etiology 08/27/2023   Other seborrheic keratosis    Primary parkinsonism 08/07/2023   Prostate cancer 2013   moderate risk T1c, Gleason 3+3, PSA 5.75, vol 57ml -- planned Radioactive seed implants 01-26-2016   Pure hypercholesterolemia 10/04/2015   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 Authorizing Provider  atorvastatin (LIPITOR) 20 MG tablet Take 20 mg by mouth daily.    [provider]  carbidopa -levodopa  (SINEMET  IR) 25-100 MG tablet TAKE 1 TABLET AT 6am/10 AM/2 PM/6 PM 12/05/23   Tat, Asberry RAMAN, DO  losartan (COZAAR) 50 MG tablet Take 1 tablet daily for blood pressure control. 09/11/17   [provider]  Multiple Vitamins-Minerals (PRESERVISION AREDS PO) Take by mouth. Focal    [provider]    Allergies: Lisinopril    Review of Systems  Skin:  Positive for wound.    Updated Vital Signs BP 132/76 (BP Location:  Right Arm)   Pulse 73   Temp 98.6 F (37 C)   Resp 18   Ht 5' 9 (1.753 m)   Wt 75.3 kg   SpO2 96%   BMI 24.51 kg/m   Physical Exam Vitals and nursing note reviewed.  Constitutional:      General: He is not in acute distress.    Appearance: He is well-developed.  HENT:     Head: Normocephalic and atraumatic.  Eyes:     Conjunctiva/sclera: Conjunctivae normal.  Cardiovascular:     Rate and Rhythm: Normal rate and regular rhythm.     Heart sounds: No murmur heard. Pulmonary:     Effort: Pulmonary effort is normal. No respiratory distress.     Breath sounds: Normal breath sounds.  Musculoskeletal:        General: No swelling.     Cervical back: Neck supple.     Comments: Multiple superficial lacerations involving his left hand. Largest approximately 1 cm involving the left middle finger distal tuft, radial pulse 2+, cap refill < 2 secs  Skin:    General: Skin is warm and dry.     Capillary Refill: Capillary refill takes less than 2 seconds.  Neurological:     Mental Status: He is alert.  Psychiatric:        Mood and Affect: Mood normal.     (all labs ordered are listed, but only  abnormal results are displayed) Labs Reviewed - No data to display  EKG: None  Radiology: No results found.   .Laceration Repair  Date/Time: 02/08/2024 4:22 PM  Performed by: Donnajean Lynwood DEL, PA-C Authorized by: Donnajean Lynwood DEL, PA-C   Consent:    Consent obtained:  Verbal   Consent given by:  Patient   Risks discussed:  Infection   Alternatives discussed:  No treatment Universal protocol:    Procedure explained and questions answered to patient or proxy's satisfaction: yes     Patient identity confirmed:  Verbally with patient Anesthesia:    Anesthesia method:  None Laceration details:    Length (cm):  1 Treatment:    Area cleansed with:  Soap and water Skin repair:    Repair method:  Tissue adhesive Repair type:    Repair type:  Simple Post-procedure details:     Procedure completion:  Tolerated    Medications Ordered in the ED  Tdap (BOOSTRIX ) injection 0.5 mL (0.5 mLs Intramuscular Given 02/08/24 1611)    Clinical Course as of 02/08/24 1624  Sat Feb 08, 2024  1613 Patient evaluated for complaints of superficial lacerations involving his left hand.  He send he is hemodynamically stable.  On exam he has multiple superficial lacerations involving the left hand, largest of which is approximately 1 cm involving the distal tuft of the left middle finger.  Hemostasis is predominantly achieved.  Would benefit from primary closure with Dermabond.  Will update tetanus status and discharge home. [JT]    Clinical Course User Index [JT] Donnajean Lynwood DEL, PA-C                                 Medical Decision Making  This patient presents to the ED with chief complaint(s) of laceration.  The complaint involves an extensive differential diagnosis and also carries with it a high risk of complications and morbidity.   Pertinent past medical history as listed in HPI  The differential diagnosis includes  Exam is not consistent with tendon/vascular/nerve injury Additional history obtained: Records reviewed Care Everywhere/External Records  Disposition:   Patient will be discharged home. The patient has been appropriately medically screened and/or stabilized in the ED. I have low suspicion for any other emergent medical condition which would require further screening, evaluation or treatment in the ED or require inpatient management. At time of discharge the patient is hemodynamically stable and in no acute distress. I have discussed work-up results and diagnosis with patient and answered all questions. Patient is agreeable with discharge plan. We discussed strict return precautions for returning to the emergency department and they verbalized understanding.     Social Determinants of Health:   none  This note was dictated with voice recognition software.   Despite best efforts at proofreading, errors may have occurred which can change the documentation meaning.       Final diagnoses:  Laceration of left middle finger without foreign body without damage to nail, initial encounter    ED Discharge Orders     None          Donnajean Lynwood DEL, PA-C 02/08/24 1624    Geraldene Hamilton, MD 02/09/24 1650

## 2024-02-08 NOTE — Discharge Instructions (Signed)
 You were evaluated in the emergency room for a laceration.  Your tetanus status was updated.  If you experience any new or worsening symptoms including fevers, chills, worsening pain, swelling or redness please return emergency room.  Otherwise please clean the wounds daily with soapy water starting tomorrow afternoon.

## 2024-02-08 NOTE — ED Notes (Signed)
 Bandaid placed over wound. Dc instructions given, pt verbalized understanding. Out of ED with steady gait, not in visible distress.

## 2024-02-12 NOTE — Progress Notes (Signed)
 Assessment/Plan:   1.  ET/PD  -Take Carbidopa /levodopa  25/100, 2 tablet at 6:30/1 at 10:30/2 at 2:30/1 at 6:30.  Looks better with the increase  -discussed CV exercise and increasing that.    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  MCI  - Neurocognitive testing in April, 2025 with evidence of MCI, very mild and I don't suspect changes  - PET scan done in May, 2025 was normal  3.  RBD  -mild and no need for medication right now  -discussed bedroom safety Subjective:   William Lindsey was seen today in follow up for Parkinsons disease.  My previous records were reviewed prior to todays visit as well as outside records available to me.  Wife on phone and supplements history.  Patient has done well since our last visit.  He continues to take his levodopa .  We cautiously added pramipexole  last visit due to rigidity but I did caution him that it still may not help tremor.  He was only able to take 2 dosages of the 0.125 mg and he was sweaty/dizzy and we d/c the medication.  We did increase his levodopa  at that time and he did fine with that.  He does golf for exercise but no CV exercise.  Wife notes rare acting out of the dreams every few months and asks if associated with Parkinsons Disease.   Current prescribed movement disorder medications: Carbidopa /levodopa  25/100, 2 tablet at 6:30/1 at 10:30/2 at 2:30/1 at 6:30 (increase)   Prior meds:  pramipexole  (took 2 pills of 0.125 mg and sweating/dizzy)  ALLERGIES:   Allergies  Allergen Reactions   Lisinopril Other (See Comments)    Or tickle in throat    CURRENT MEDICATIONS:  Current Meds  Medication Sig   atorvastatin (LIPITOR) 20 MG tablet Take 20 mg by mouth daily.   carbidopa -levodopa  (SINEMET  IR) 25-100 MG tablet TAKE 1 TABLET AT 6am/10 AM/2 PM/6 PM   losartan (COZAAR) 50 MG tablet Take 1 tablet daily for blood pressure control.   Multiple Vitamins-Minerals (PRESERVISION  AREDS PO) Take by mouth. Focal     Objective:   PHYSICAL EXAMINATION:    VITALS:   Vitals:   02/18/24 1041  BP: (!) 130/90  Pulse: 74  SpO2: 98%  Weight: 167 lb 6.4 oz (75.9 kg)  Height: 5' 8 (1.727 m)     GEN:  The patient appears stated age and is in NAD. HEENT:  Normocephalic, atraumatic.  The mucous membranes are moist. The superficial temporal arteries are without ropiness or tenderness. CV:  RRR Lungs:  CTAB Neck/HEME:  There are no carotid bruits bilaterally.  Neurological examination:  Orientation: The patient is alert and oriented x3. Cranial nerves: There is good facial symmetry with no significant facial hypomimia. The speech is fluent and clear. Soft palate rises symmetrically and there is no tongue deviation. Hearing is intact to conversational tone. Sensation: Sensation is intact to light touch throughout Motor: Strength is at least antigravity x4.  Movement examination: Tone: There is nl tone in the UE/LE (improved) Abnormal movements: there is RUE rest tremor, mild to mod near constant.   Coordination:  There is no decremation with RAM's, with any form of RAMS, including alternating supination and pronation of the forearm, hand opening and closing, finger taps, heel taps and toe taps.  Gait and Station: The patient has no difficulty arising out of a deep-seated chair without the use of the  hands. The patient's stride length is good with good arm swing bilaterally.  The patient has a negative pull test.    I have reviewed and interpreted the following labs independently    Chemistry      Component Value Date/Time   NA 140 06/18/2023 1044   K 4.8 06/18/2023 1044   CL 104 06/18/2023 1044   CO2 23 06/18/2023 1044   BUN 21 06/18/2023 1044   CREATININE 1.40 (H) 06/18/2023 1044      Component Value Date/Time   CALCIUM 8.8 06/18/2023 1044   ALKPHOS 58 06/18/2023 1044   AST 12 06/18/2023 1044   ALT 5 06/18/2023 1044   BILITOT 0.5 06/18/2023 1044        Lab Results  Component Value Date   WBC 6.3 01/19/2016   HGB 14.5 01/19/2016   HCT 42.4 01/19/2016   MCV 89.3 01/19/2016   PLT 250 01/19/2016    Lab Results  Component Value Date   TSH 1.650 04/11/2022     Cc:  Anita Bernardino BROCKS, FNP

## 2024-02-18 ENCOUNTER — Encounter: Payer: Self-pay | Admitting: Neurology

## 2024-02-18 ENCOUNTER — Ambulatory Visit (INDEPENDENT_AMBULATORY_CARE_PROVIDER_SITE_OTHER): Admitting: Neurology

## 2024-02-18 VITALS — BP 130/90 | HR 74 | Ht 68.0 in | Wt 167.4 lb

## 2024-02-18 DIAGNOSIS — G4752 REM sleep behavior disorder: Secondary | ICD-10-CM | POA: Diagnosis not present

## 2024-02-18 DIAGNOSIS — G20A1 Parkinson's disease without dyskinesia, without mention of fluctuations: Secondary | ICD-10-CM

## 2024-02-18 MED ORDER — CARBIDOPA-LEVODOPA 25-100 MG PO TABS: ORAL_TABLET | ORAL | 1 refills | Status: DC

## 2024-02-18 MED ORDER — CARBIDOPA-LEVODOPA 25-100 MG PO TABS: ORAL_TABLET | ORAL | 1 refills | Status: AC

## 2024-02-18 NOTE — Patient Instructions (Signed)
 Join us  on social media and stay up to date with what is going on in the [place] regarding Parkinson's Disease and Movement Disorders!

## 2024-05-12 NOTE — Progress Notes (Addendum)
 " [facility] Outpatient Consult Note    Primary Care Physician: Shawn P Lazoff, DO Provider requesting consultation: Lazoff, Shawn P, DO  Dear Dr. 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  Below is the documentation for today's visit.     Subjective:   History of Present Illness:  Mr.William Lindsey is a 78 y.o. patient with chronic kidney disease stage III dating back to 01/18/2026 per epic search with serum creatinine 1.27 eGFR 55 noted.  Patient referred to office for evaluation of acute kidney injury.  Serum creatinine has trended 1.27-1.46 since 02/25/2020 however increase in serum creatinine noted to 2.54 03/13/1954 and is progressively climbed to current serum creatinine of 3.10 eGFR 20. Patient says he has never been referred to nephrology or told he had renal disease until today. Past medical history significant for Parkinson's disease, history of prostate cancer, essential hypertension, hypercholesterolemia.  Patient denies family history of renal disease or any family members on renal replacement therapy. Past history of prostate cancer, recent renal ultrasound 04/30/2024 with right nonobstructive nephrolithiasis in the lower pole measuring 0.9 cm previously 1.2 cm no hydronephrosis present.  He denies history of previous autoimmune disorders, frequent UTIs obstructive uropathy, glomerular disease HIV, illicit substance abuse, NSAID use.  Patient does not appear acutely ill . Patient says that he feels well, denies symptoms of overt uremia.  Patient says he is surprised because he thought he was healthy other than Parkinson's disease.  He denies gross hematuria, chronic dysuria or flank pain.  No difficulty urinating- no urinary urgency or retention.  Cannot truly differentiate if this is acute kidney injury versus progression of chronic kidney disease. While in office he was contacted by [facility] who requested him to come to office for further imaging.  Encouraged him to call them back and go to their office as soon as possible, possibility that previously nonobstructing stone could now be obstructive. Will follow in 2 weeks and order serologies, repeat renal function, renal electrolytes.  POC UA moderate blood present otherwise unremarkable Emotional support to patient and wife.  Review of Systems A complete ROS was performed with pertinent positives and negatives noted in the HPI. The remainder of the ROS are negative.   Past History:   Active Ambulatory Problems    Diagnosis Date Noted   Pure hypercholesterolemia 10/04/2015   Male erectile disorder 10/04/2015   Essential hypertension 04/05/2016   Diastasis recti 05/19/2019   Stage 3a chronic kidney disease (CMD) 03/06/2020   Resting tremor 03/01/2021   Encounter for Medicare annual wellness exam 03/01/2021   History of prostate cancer 03/14/2022   Primary parkinsonism    (CMD) 08/07/2023   Resolved Ambulatory Problems    Diagnosis Date Noted   No Resolved Ambulatory Problems   Past Medical History:  Diagnosis Date   HL (hearing loss)    Hypercholesterolemia    Hypertension    Impingement syndrome, shoulder, right 05/19/2019   Malignant neoplasm of prostate    (CMD) 10/21/2015   Tubular adenoma of colon    Family History[1] Social History Social History[2] Social History   Social History Narrative   Wife: [name].    Poodle.   Retired from [employer], [place], [place]..  Mother, William Lindsey, has dementia.     Medications:   Current Rx ordered in [facility][3]  Medication and other allergies: Lisinopril    Physical Exam:   Vitals:  05/12/24 1114  BP: 144/72  Patient Position: Sitting  Pulse: 73  Weight: 77.6 kg (171 lb)   Wt Readings from Last 3 Encounters:  05/12/24 77.6 kg (171 lb)  04/30/24 74.4 kg (164 lb)  04/28/24 75.8 kg (167 lb 3.2 oz)     General appearance: alert, appears stated age, and cooperative Head: Normocephalic, without obvious abnormality, atraumatic Eyes- Perrla, EOMI Throat: lips, mucosa, and tongue normal; teeth and gums normal  Neck: no adenopathy, no carotid bruit, no JVD, supple, symmetrical, trachea midline, and thyroid not enlarged, symmetric, no tenderness/mass/nodules Lungs:clear to auscultation bilaterally.  Heart: regular rate and rhythm, S1, S2 normal, no murmur, click, rub or gallop Abdomen: soft, non-tender; bowel sounds normal; no masses,  no organomegaly Extremities: No lower extremity edema Skin: Skin color, texture, turgor normal. No rashes or lesions Neurologic: Alert and oriented, CN I-XII intact.  Resting tremor noted right hand    Results for orders placed or performed in visit on 05/12/24  POC Urinalysis Auto without Microscopic   Collection Time: 05/12/24 12:10 PM  Result Value Ref Range   Color, Urine Yellow Yellow   Clarity, Urine Clear Clear   Glucose, Urine Negative Negative mg/dL   Bilirubin, Urine Negative Negative   Ketones, Urine Negative Negative mg/dL   Specific Gravity, Urine 1.010 1.010, 1.015, 1.020, 1.025   Blood, Urine Moderate (A) Negative   pH, Urine 5.5 5.0, 5.5, 6.0, 6.5, 7.0, 7.5, 8.0   Protein, Urine Negative Negative mg/dL   Urobilinogen, Urine 0.2 <2.0 mg/dL   Nitrite, Urine Negative Negative   Leukocyte Esterase, Urine Negative Negative   Kit/Device Lot # [id number]    Kit/Device Expiration Date 63026       Diagnostic Review:   Care provided today involved reviewing multiple databases to make recommendations as above    Assessment/ Plan:   1. AKI (acute kidney injury)   2. Stage 3a chronic kidney disease (CMD)   3. History of kidney stones   4. 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 Secondary hyperparathyroidism (CMD)   6. Hematuria, unspecified type   7. Essential hypertension     Plan: Longstanding CKD 3A since 2027.   Unclear if this is acute kidney injury or progression of chronic kidney disease.  Return to the office in 2 weeks.  Will orders serologies, renal electrolytes.  Losartan currently on hold.  Blood pressure acceptable.  0.9 cm nonobstructing stone noted on renal ultrasound 04/29/2024.  Called by [facility] during appointment who requested that he return to office for further imaging.  Encouraged him to keep this appointment and to go as soon as always possibility that non-obstructing stone could not be obstructing.  Last hemoglobin 8.0.  No history of acute blood loss.  Suspect this is anemia anemia of chronic kidney disease.  Recent anemia panel 04/28/2024 Iron 26 Tsat 9 Ferritin 226. Started on oral iron but do not believe improved iron panel ordered 05/07/2024. Will recheck next visit and initiate iron infusion/retacrit if needed.  Moderate blood on POC UA.  Patient says this is chronic. He is seeing urology hopefully today. Encouraged to increase p.o. fluid intake-drink 1 to 1.5 L daily Continue to avoid nephrotoxic medications renal dose all medications to appropriate and current creatinine clearance  Follow-up in 2 weeks  No orders of the defined types were placed in this encounter.  Orders Placed This Encounter  Procedures   Albumin, Random Urine   Free Kappa And Lambda Light Chains with  Ratio, Quantitative    Standing Status:   Future    Expected Date:   05/26/2024    Expiration Date:   05/12/2025    Release to patient::   Immediate   Protein Electrophoresis, Serum    Standing Status:   Future    Expected Date:   05/26/2024    Expiration Date:   05/12/2025    Release to patient::   Immediate   Albumin, Random Urine    Standing Status:   Future    Expected Date:   05/26/2024    Expiration Date:   05/12/2025    Release to patient::   Immediate   Antineutrophil Cytoplasmic Antibodies (ANCA) by IFA    Standing Status:   Future    Expected Date:   05/26/2024    Expiration Date:    05/12/2025    Release to patient::   Immediate   Antiglomerular Basement Membrane Antibodies, Quantitative    Standing Status:   Future    Expected Date:   05/26/2024    Expiration Date:   05/12/2025    Release to patient::   Immediate   CBC with Differential    Standing Status:   Future    Expected Date:   05/26/2024    Expiration Date:   05/12/2025   Renal Function Panel    Standing Status:   Future    Expected Date:   05/26/2024    Expiration Date:   05/12/2025   Sodium, Random Urine    Standing Status:   Future    Expected Date:   05/26/2024    Expiration Date:   05/12/2025    Release to patient::   Immediate   Creatinine, Random Urine    Standing Status:   Future    Expected Date:   05/26/2024    Expiration Date:   05/12/2025    Release to patient::   Immediate   POC Urinalysis Auto without Microscopic     Return in about 2 weeks (around 05/26/2024) for Recheck.  Thank you for the opportunity to provide consultation for your patient. If I can be of further assistance please do not hesitate to contact my office.   Ricka Deems [facility] 05/12/2024 5:25 PM       [1] Family History Problem Relation Name Age of Onset   Cancer Father Sorin Frimpong   [2] Social History Tobacco Use   Smoking status: Never    Passive exposure: Never   Smokeless tobacco: Never  Vaping Use   Vaping status: Never Used  Substance Use Topics   Alcohol use: Never   Drug use: Never  [3] Meds Ordered in [facility]  Medication Sig Dispense Refill   aspirin (ECOTRIN) 325 mg EC tablet Take 325 mg by mouth daily.     atorvastatin  (LIPITOR) 20 mg tablet TAKE 1 TABLET(20 MG) BY MOUTH DAILY FOR CHOLESTEROL 90 tablet 1   calcium  carbonate-vitamin D3 500 mg (200 mg Ca)-5 mcg (200 units Vit D) per tablet Take 1 tablet by mouth in the morning and 1 tablet in the evening. Take with meals.     carbidopa -levodopa  (SINEMET ) 25-100 mg per tablet Take 1 tablet by mouth.     ferrous sulfate  325  mg (65 mg iron) tablet Take 325 mg by mouth daily before breakfast.     multivit-minerals-folic acid-lycopen-lutein (CENTRUM SILVER) 0.4 mg-300 mcg- 250 mcg tab tablet Take 1 tablet by mouth daily.     multivit-minerals-folic acid-lycopen-lutein (CENTRUM SILVER) 0.4 mg-300 mcg- 250 mcg tab  tablet Take 1 tablet by mouth daily.     nitroglycerin  (NITROSTAT ) 0.4 mg SL tablet Place 1 tablet (0.4 mg total) under the tongue every 5 (five) minutes as needed for chest pain. 20 tablet 0   vitamins A,C,E-zinc-copper (I-VITE PROTECT) 2,148 mcg-113 mg-45 mg-17.4mg  tab Take 1 tablet by mouth daily.     losartan (COZAAR) 50 mg tablet TAKE 1 TABLET BY MOUTH DAILY FOR BLOOD PRESSURE (Patient not taking: Reported on 05/12/2024) 90 tablet 3   No current Epic-ordered facility-administered medications on file.  "

## 2024-06-01 ENCOUNTER — Encounter (HOSPITAL_BASED_OUTPATIENT_CLINIC_OR_DEPARTMENT_OTHER): Payer: Self-pay | Admitting: Emergency Medicine

## 2024-06-01 ENCOUNTER — Observation Stay (HOSPITAL_BASED_OUTPATIENT_CLINIC_OR_DEPARTMENT_OTHER)
Admission: EM | Admit: 2024-06-01 | Discharge: 2024-06-04 | Disposition: A | Discharge status: Home / Self Care | Attending: Emergency Medicine | Admitting: Emergency Medicine

## 2024-06-01 ENCOUNTER — Other Ambulatory Visit: Payer: Self-pay

## 2024-06-01 ENCOUNTER — Ambulatory Visit: Payer: Self-pay

## 2024-06-01 DIAGNOSIS — D62 Acute posthemorrhagic anemia: Secondary | ICD-10-CM | POA: Insufficient documentation

## 2024-06-01 DIAGNOSIS — N179 Acute kidney failure, unspecified: Secondary | ICD-10-CM | POA: Insufficient documentation

## 2024-06-01 DIAGNOSIS — I129 Hypertensive chronic kidney disease with stage 1 through stage 4 chronic kidney disease, or unspecified chronic kidney disease: Secondary | ICD-10-CM | POA: Insufficient documentation

## 2024-06-01 DIAGNOSIS — Z7982 Long term (current) use of aspirin: Secondary | ICD-10-CM | POA: Diagnosis not present

## 2024-06-01 DIAGNOSIS — N189 Chronic kidney disease, unspecified: Secondary | ICD-10-CM

## 2024-06-01 DIAGNOSIS — R7689 Other specified abnormal immunological findings in serum: Secondary | ICD-10-CM

## 2024-06-01 DIAGNOSIS — D638 Anemia in other chronic diseases classified elsewhere: Secondary | ICD-10-CM

## 2024-06-01 DIAGNOSIS — E785 Hyperlipidemia, unspecified: Secondary | ICD-10-CM | POA: Insufficient documentation

## 2024-06-01 DIAGNOSIS — I10 Essential (primary) hypertension: Secondary | ICD-10-CM | POA: Diagnosis present

## 2024-06-01 DIAGNOSIS — N184 Chronic kidney disease, stage 4 (severe): Secondary | ICD-10-CM | POA: Diagnosis not present

## 2024-06-01 DIAGNOSIS — C61 Malignant neoplasm of prostate: Secondary | ICD-10-CM | POA: Diagnosis present

## 2024-06-01 DIAGNOSIS — Z8546 Personal history of malignant neoplasm of prostate: Secondary | ICD-10-CM | POA: Insufficient documentation

## 2024-06-01 DIAGNOSIS — R310 Gross hematuria: Secondary | ICD-10-CM | POA: Diagnosis present

## 2024-06-01 DIAGNOSIS — G20C Parkinsonism, unspecified: Secondary | ICD-10-CM | POA: Diagnosis not present

## 2024-06-01 DIAGNOSIS — N1831 Chronic kidney disease, stage 3a: Secondary | ICD-10-CM | POA: Diagnosis present

## 2024-06-01 DIAGNOSIS — E875 Hyperkalemia: Secondary | ICD-10-CM | POA: Insufficient documentation

## 2024-06-01 LAB — URINALYSIS, MICROSCOPIC (REFLEX)

## 2024-06-01 LAB — URINALYSIS, ROUTINE W REFLEX MICROSCOPIC
Glucose, UA: 250 mg/dL — AB
Nitrite: POSITIVE — AB
Protein, ur: >300 mg/dL — AB
Specific Gravity, Urine: 1.02 (ref 1.005–1.030)
pH: 6.5 (ref 5.0–8.0)

## 2024-06-01 MED ORDER — OXYCODONE-ACETAMINOPHEN 5-325 MG PO TABS
1.0000 | ORAL_TABLET | ORAL | Status: AC | PRN
  Administered 2024-06-01 (×2): 1 via ORAL
  Filled 2024-06-01 (×3): qty 1

## 2024-06-01 NOTE — Telephone Encounter (Signed)
 Patient is calling in regards to having to cancel his appointment today but is still needing to speak to the provider. He is needing to discuss the lab results and his visit with the urologist today. Patient left to use the restroom during the call and wife began speaking about the pain from a treatment he received today, and that he has been urinating more frequently. She also stated he had blood in his urine, agent then advised this could be escalated to the nurse but she denied advising that the Urologist said this was going to happen and this did not need to be escalated. Call back is [phone].

## 2024-06-01 NOTE — ED Triage Notes (Signed)
 [facility] today. Cystoscopy. Now is in intense pain, burning with urination. Feels as though he is emptying completely. No thinners.

## 2024-06-01 NOTE — ED Provider Notes (Signed)
 "  [place] [facility] AT [facility]  Provider Note  CSN: [id number] Arrival date & time: 06/01/24 2211  History Chief Complaint  Patient presents with   Hematuria    Post cysto    William Lindsey is a 79 y.o. male with history of chronic kidney disease, has been worsening recently with increase in Cr to >3 was also noted to have blood in urine specimen. He saw Urology earlier today for cystoscopy which was reportedly clear. Since then procedure he has had gross hematuria, dribbling and urinary frequency/urgency.    Home Medications Prior to Admission medications  Medication Sig Start Date End Date Taking? Authorizing Provider  atorvastatin  (LIPITOR) 20 MG tablet Take 20 mg by mouth daily.    [provider]  carbidopa -levodopa  (SINEMET  IR) 25-100 MG tablet 2 tablet at 6:30/1 at 10:30/2 at 2:30/1 at 6:30 02/18/24   Tat, Rebecca S, DO  losartan (COZAAR) 50 MG tablet Take 1 tablet daily for blood pressure control. 09/11/17   [provider]  Multiple Vitamins-Minerals (PRESERVISION AREDS PO) Take by mouth. Focal    [provider]     Allergies    Lisinopril   Review of Systems   Review of Systems Please see HPI for pertinent positives and negatives  Physical Exam BP (!) 144/78   Pulse 70   Temp 98.4 F (36.9 C) (Oral)   Resp 15   SpO2 100%   Physical Exam Vitals and nursing note reviewed.  HENT:     Head: Normocephalic.     Nose: Nose normal.  Eyes:     Extraocular Movements: Extraocular movements intact.  Pulmonary:     Effort: Pulmonary effort is normal.  Musculoskeletal:        General: Normal range of motion.     Cervical back: Neck supple.  Skin:    Findings: No rash (on exposed skin).  Neurological:     Mental Status: He is alert and oriented to person, place, and time.  Psychiatric:        Mood and Affect: Mood normal.     ED Results / Procedures / Treatments    EKG None  Procedures Procedures  Medications Ordered in the ED Medications  morphine  (PF) 4 MG/ML injection 4 mg (0 mg Intravenous Hold 06/02/24 0148)  cefTRIAXone  (ROCEPHIN ) 1 g in sodium chloride  0.9 % 100 mL IVPB (has no administration in time range)  oxyCODONE -acetaminophen  (PERCOCET/ROXICET) 5-325 MG per tablet 1 tablet (1 tablet Oral Given 06/01/24 2308)  oxybutynin  (DITROPAN ) tablet 5 mg (5 mg Oral Given 06/02/24 0042)    Initial Impression and Plan  Patient here with gross hematuria, urgency and frequency after cystoscopy earlier in the day. Per his and wife's report, no biopsies were taken and they were told everything looked fine. Will check UA, place catheter for bladder irrigation.   ED Course   Clinical Course as of 06/02/24 9367  Tue Jun 02, 2024  0003 UA here with gross hematuria, also some signs of infection. He had outpatient labs done a few days ago which were reviewed and suggestive of MGUS/Myeloma. He did not have proteinuria on his most recent outpatient UA but does today which may be contributing to his worsening renal function. Will add blood work, including LFTs and INR to ensure no coagulopathy given his degree of bleeding.  [CS]  0014 RN reports pink tinged urine without clots on irrigation. Will give a dose of ditropan .  [CS]  0107 CBC with worsening Hgb,  now under 7.0. Was 8.1 on 1/9. May be partially due to bleeding but also from suspected myeloma. INR is normal.  [CS]  0131 CMP with mild hypnatremia, Cr about at recent baseline. No hypercalcemia. Discussed results with patient and wife at bedside including outpatient labs with elevated free light chains and SPEP with M-spike. Given his worsening anemia, recommend admission for further evaluation by Hem/Onc as well as Urology for his gross hematuria. Hospitalist paged.  [CS]  [phone] Spoke with Dr. Charlton, Hospitalist, who requests we consult Urology to determine if imaging would be helpful, then he will accept for  admission to [facility].  [CS]  0631 Hgb stable, hematuria improved.  [CS]    Clinical Course User Index [CS] Roselyn Carlin NOVAK, MD     MDM Rules/Calculators/A&P Medical Decision Making Problems Addressed: Anemia of chronic disease: chronic illness or injury with exacerbation, progression, or side effects of treatment Chronic kidney disease, unspecified CKD stage: chronic illness or injury Elevated serum immunoglobulin free light chains: undiagnosed new problem with uncertain prognosis Gross hematuria: acute illness or injury  Amount and/or Complexity of Data Reviewed Labs: ordered. Decision-making details documented in ED Course.  Risk Prescription drug management. Parenteral controlled substances. Decision regarding hospitalization.     Final Clinical Impression(s) / ED Diagnoses Final diagnoses:  Gross hematuria  Anemia of chronic disease  Chronic kidney disease, unspecified CKD stage  Elevated serum immunoglobulin free light chains    Rx / DC Orders ED Discharge Orders     None        Roselyn Carlin NOVAK, MD 06/02/24 0201  "

## 2024-06-01 NOTE — Telephone Encounter (Signed)
 FYI Only or Action Required?: Action required by provider: update on patient condition.  Patient was last seen in primary care on 01/20/2024 by Vivienne Delon HERO, PA-C.  Called Nurse Triage reporting Urinary Frequency, Hematuria, and painful urination.  Symptoms began today.  Interventions attempted: Nothing.  Symptoms are: unchanged.  Triage Disposition: home care  Patient/caregiver understands and will follow disposition?: Yes  Copied from CRM #[id number]. Topic: Clinical - Red Word Triage >> Jun 01, 2024  3:43 PM Mercer PEDLAR wrote: Red Word that prompted transfer to Nurse Triage: Diane (wife) stating patient is in severe pain when urinating. Reason for Disposition  All other males with painful urination    Home CARE  Answer Assessment - Initial Assessment Questions Patient with cystoscopy today experiencing burning pain and hematuria.  Has called specialist's office without return phone call.   Encouraged to drink fluids, tylenol  for pain along with heating pad.   If any symptoms worsen or do not improve, call back to specialist, PCP office, or go to ED.   Verbalizes understanding  Protocols used: Urination Pain - Male-A-AH

## 2024-06-02 DIAGNOSIS — R310 Gross hematuria: Secondary | ICD-10-CM | POA: Diagnosis not present

## 2024-06-02 DIAGNOSIS — N189 Chronic kidney disease, unspecified: Secondary | ICD-10-CM | POA: Diagnosis present

## 2024-06-02 DIAGNOSIS — Z7401 Bed confinement status: Secondary | ICD-10-CM | POA: Diagnosis not present

## 2024-06-02 DIAGNOSIS — D638 Anemia in other chronic diseases classified elsewhere: Secondary | ICD-10-CM | POA: Diagnosis present

## 2024-06-02 DIAGNOSIS — R300 Dysuria: Secondary | ICD-10-CM | POA: Diagnosis not present

## 2024-06-02 DIAGNOSIS — D62 Acute posthemorrhagic anemia: Secondary | ICD-10-CM | POA: Diagnosis present

## 2024-06-02 DIAGNOSIS — R7689 Other specified abnormal immunological findings in serum: Secondary | ICD-10-CM | POA: Diagnosis present

## 2024-06-02 LAB — BASIC METABOLIC PANEL WITH GFR
Anion gap: 11 (ref 5–15)
BUN: 38 mg/dL — ABNORMAL HIGH (ref 8–23)
CO2: 22 mmol/L (ref 22–32)
Calcium: 8.8 mg/dL — ABNORMAL LOW (ref 8.9–10.3)
Chloride: 99 mmol/L (ref 98–111)
Creatinine, Ser: 3.16 mg/dL — ABNORMAL HIGH (ref 0.61–1.24)
GFR, Estimated: 19 mL/min — ABNORMAL LOW
Glucose, Bld: 97 mg/dL (ref 70–99)
Potassium: 4.4 mmol/L (ref 3.5–5.1)
Sodium: 132 mmol/L — ABNORMAL LOW (ref 135–145)

## 2024-06-02 LAB — CBC WITH DIFFERENTIAL/PLATELET
Abs Immature Granulocytes: 0.03 K/uL (ref 0.00–0.07)
Abs Immature Granulocytes: 0.03 K/uL (ref 0.00–0.07)
Basophils Absolute: 0 K/uL (ref 0.0–0.1)
Basophils Absolute: 0 K/uL (ref 0.0–0.1)
Basophils Relative: 0 %
Basophils Relative: 0 %
Eosinophils Absolute: 0.2 K/uL (ref 0.0–0.5)
Eosinophils Absolute: 0.3 K/uL (ref 0.0–0.5)
Eosinophils Relative: 3 %
Eosinophils Relative: 4 %
HCT: 19.9 % — ABNORMAL LOW (ref 39.0–52.0)
HCT: 19.6 % — ABNORMAL LOW (ref 39.0–52.0)
Hemoglobin: 6.9 g/dL — CL (ref 13.0–17.0)
Hemoglobin: 6.8 g/dL — CL (ref 13.0–17.0)
Immature Granulocytes: 1 %
Immature Granulocytes: 0 %
Lymphocytes Relative: 14 %
Lymphocytes Relative: 21 %
Lymphs Abs: 0.9 K/uL (ref 0.7–4.0)
Lymphs Abs: 1.5 K/uL (ref 0.7–4.0)
MCH: 33.2 pg (ref 26.0–34.0)
MCH: 33.5 pg (ref 26.0–34.0)
MCHC: 34.7 g/dL (ref 30.0–36.0)
MCHC: 34.7 g/dL (ref 30.0–36.0)
MCV: 95.7 fL (ref 80.0–100.0)
MCV: 96.6 fL (ref 80.0–100.0)
Monocytes Absolute: 0.5 K/uL (ref 0.1–1.0)
Monocytes Absolute: 0.6 K/uL (ref 0.1–1.0)
Monocytes Relative: 8 %
Monocytes Relative: 9 %
Neutro Abs: 4.8 K/uL (ref 1.7–7.7)
Neutro Abs: 4.7 K/uL (ref 1.7–7.7)
Neutrophils Relative %: 74 %
Neutrophils Relative %: 66 %
Platelets: 181 K/uL (ref 150–400)
Platelets: 185 K/uL (ref 150–400)
RBC: 2.08 MIL/uL — ABNORMAL LOW (ref 4.22–5.81)
RBC: 2.03 MIL/uL — ABNORMAL LOW (ref 4.22–5.81)
RDW: 13.1 % (ref 11.5–15.5)
RDW: 13 % (ref 11.5–15.5)
WBC: 6.5 K/uL (ref 4.0–10.5)
WBC: 7 K/uL (ref 4.0–10.5)
nRBC: 0 % (ref 0.0–0.2)
nRBC: 0 % (ref 0.0–0.2)

## 2024-06-02 LAB — COMPREHENSIVE METABOLIC PANEL WITH GFR
ALT: <5 U/L (ref 0–44)
AST: 12 U/L — ABNORMAL LOW (ref 15–41)
Albumin: 3.4 g/dL — ABNORMAL LOW (ref 3.5–5.0)
Alkaline Phosphatase: 52 U/L (ref 38–126)
Anion gap: 13 (ref 5–15)
BUN: 39 mg/dL — ABNORMAL HIGH (ref 8–23)
CO2: 20 mmol/L — ABNORMAL LOW (ref 22–32)
Calcium: 8.4 mg/dL — ABNORMAL LOW (ref 8.9–10.3)
Chloride: 96 mmol/L — ABNORMAL LOW (ref 98–111)
Creatinine, Ser: 3.13 mg/dL — ABNORMAL HIGH (ref 0.61–1.24)
GFR, Estimated: 19 mL/min — ABNORMAL LOW
Glucose, Bld: 98 mg/dL (ref 70–99)
Potassium: 4.2 mmol/L (ref 3.5–5.1)
Sodium: 129 mmol/L — ABNORMAL LOW (ref 135–145)
Total Bilirubin: 0.6 mg/dL (ref 0.0–1.2)
Total Protein: 8.1 g/dL (ref 6.5–8.1)

## 2024-06-02 LAB — HEMOGLOBIN AND HEMATOCRIT, BLOOD
HCT: 22.3 % — ABNORMAL LOW (ref 39.0–52.0)
HCT: 21.4 % — ABNORMAL LOW (ref 39.0–52.0)
Hemoglobin: 7.6 g/dL — ABNORMAL LOW (ref 13.0–17.0)
Hemoglobin: 7.5 g/dL — ABNORMAL LOW (ref 13.0–17.0)

## 2024-06-02 LAB — PREPARE RBC (CROSSMATCH)

## 2024-06-02 LAB — ABO/RH: ABO/RH(D): A POS

## 2024-06-02 LAB — PROTIME-INR
INR: 1.1 (ref 0.8–1.2)
Prothrombin Time: 15 s (ref 11.4–15.2)

## 2024-06-02 MED ORDER — OYSTER SHELL CALCIUM/D3 500-5 MG-MCG PO TABS
1.0000 | ORAL_TABLET | Freq: Two times a day (BID) | ORAL | Status: DC
  Administered 2024-06-02 – 2024-06-04 (×4): 1 via ORAL
  Filled 2024-06-02 (×4): qty 1

## 2024-06-02 MED ORDER — SODIUM CHLORIDE 0.9 % IV BOLUS
1000.0000 mL | Freq: Once | INTRAVENOUS | Status: AC
  Administered 2024-06-02: 1000 mL via INTRAVENOUS

## 2024-06-02 MED ORDER — CARBIDOPA-LEVODOPA 25-100 MG PO TABS: 2.0000 | ORAL_TABLET | Freq: Four times a day (QID) | ORAL | Status: DC

## 2024-06-02 MED ORDER — SODIUM CHLORIDE 0.9 % IV SOLN
1.0000 g | INTRAVENOUS | Status: DC
  Administered 2024-06-03 (×2): 1 g via INTRAVENOUS
  Filled 2024-06-02 (×2): qty 10

## 2024-06-02 MED ORDER — ONDANSETRON HCL 4 MG PO TABS: 4.0000 mg | ORAL_TABLET | Freq: Four times a day (QID) | ORAL | Status: DC | PRN

## 2024-06-02 MED ORDER — NITROGLYCERIN 0.4 MG SL SUBL: 0.4000 mg | SUBLINGUAL_TABLET | SUBLINGUAL | Status: DC | PRN

## 2024-06-02 MED ORDER — ACETAMINOPHEN 325 MG PO TABS: 650.0000 mg | ORAL_TABLET | Freq: Four times a day (QID) | ORAL | Status: DC | PRN

## 2024-06-02 MED ORDER — ONDANSETRON HCL 4 MG/2ML IJ SOLN: 4.0000 mg | Freq: Four times a day (QID) | INTRAMUSCULAR | Status: DC | PRN

## 2024-06-02 MED ORDER — OXYBUTYNIN CHLORIDE 5 MG PO TABS
5.0000 mg | ORAL_TABLET | Freq: Once | ORAL | Status: AC
  Administered 2024-06-02: 5 mg via ORAL
  Filled 2024-06-02: qty 1

## 2024-06-02 MED ORDER — ATORVASTATIN CALCIUM 20 MG PO TABS
20.0000 mg | ORAL_TABLET | Freq: Every day | ORAL | Status: DC
  Administered 2024-06-02 – 2024-06-04 (×3): 20 mg via ORAL
  Filled 2024-06-02 (×3): qty 1

## 2024-06-02 MED ORDER — SODIUM CHLORIDE 0.9 % IV SOLN: INTRAVENOUS | Status: DC

## 2024-06-02 MED ORDER — SODIUM CHLORIDE 0.9 % IV SOLN
1.0000 g | Freq: Once | INTRAVENOUS | Status: AC
  Administered 2024-06-02: 1 g via INTRAVENOUS
  Filled 2024-06-02: qty 10

## 2024-06-02 MED ORDER — MORPHINE SULFATE (PF) 4 MG/ML IV SOLN
4.0000 mg | Freq: Once | INTRAVENOUS | Status: AC
  Administered 2024-06-02: 4 mg via INTRAVENOUS
  Filled 2024-06-02 (×2): qty 1

## 2024-06-02 MED ORDER — FERROUS SULFATE 325 (65 FE) MG PO TABS
325.0000 mg | ORAL_TABLET | Freq: Every day | ORAL | Status: DC
  Administered 2024-06-03 – 2024-06-04 (×2): 325 mg via ORAL
  Filled 2024-06-02 (×2): qty 1

## 2024-06-02 MED ORDER — FENTANYL CITRATE (PF) 50 MCG/ML IJ SOSY
50.0000 ug | PREFILLED_SYRINGE | Freq: Once | INTRAMUSCULAR | Status: AC
  Administered 2024-06-02: 50 ug via INTRAVENOUS
  Filled 2024-06-02: qty 1

## 2024-06-02 MED ORDER — CARBIDOPA-LEVODOPA 25-100 MG PO TABS
2.0000 | ORAL_TABLET | ORAL | Status: DC
  Administered 2024-06-03 – 2024-06-04 (×4): 2 via ORAL
  Filled 2024-06-02 (×4): qty 2

## 2024-06-02 MED ORDER — CARBIDOPA-LEVODOPA 25-100 MG PO TABS
1.0000 | ORAL_TABLET | ORAL | Status: DC
  Administered 2024-06-02 – 2024-06-04 (×4): 1 via ORAL
  Filled 2024-06-02 (×4): qty 1

## 2024-06-02 MED ORDER — HYOSCYAMINE SULFATE 0.125 MG SL SUBL
0.1250 mg | SUBLINGUAL_TABLET | SUBLINGUAL | Status: DC | PRN
  Administered 2024-06-02 – 2024-06-03 (×2): 0.125 mg via SUBLINGUAL
  Filled 2024-06-02 (×2): qty 1

## 2024-06-02 MED ORDER — OXYCODONE HCL 5 MG PO TABS
5.0000 mg | ORAL_TABLET | Freq: Four times a day (QID) | ORAL | Status: DC | PRN
  Administered 2024-06-03: 5 mg via ORAL
  Filled 2024-06-02: qty 1

## 2024-06-02 MED ORDER — ORAL CARE MOUTH RINSE: 15.0000 mL | OROMUCOSAL | Status: DC | PRN

## 2024-06-02 MED ORDER — ACETAMINOPHEN 650 MG RE SUPP: 650.0000 mg | Freq: Four times a day (QID) | RECTAL | Status: DC | PRN

## 2024-06-02 MED ORDER — MORPHINE SULFATE (PF) 4 MG/ML IV SOLN
4.0000 mg | Freq: Once | INTRAVENOUS | Status: AC
  Administered 2024-06-02: 4 mg via INTRAVENOUS
  Filled 2024-06-02: qty 1

## 2024-06-02 MED ORDER — SODIUM CHLORIDE 0.9% IV SOLUTION: Freq: Once | INTRAVENOUS | Status: AC

## 2024-06-02 NOTE — Consult Note (Signed)
 "  Urology Consult Note   Requesting Attending Physician:  Celinda Alm Lot, MD Service Providing Consult: Urology  Consulting Attending: Dr. Cam   Reason for Consult:  bladder spasm, hematuria  HPI: William Lindsey is seen in consultation for reasons noted above at the request of Celinda Alm Lot, MD. Patient is a 79 y.o. male presenting  For baldder spasm and suspected hematuria. Pt is known to our practice and underwent surveillance cystoscopy with Dr. Alvaro on 06/01/24- yesterday. Pt reports that shortly after he began to have severe urinary urgency with some dripping blood per urethra, prompting him to present to [facility]. Foley catheter was placed and empiric ABX were started. On arrival, pt was alert, oriented, and in no distress. Reviewed case and plan with he, his wife, and nurse. All questions were answered to their satisfaction.  ------------------  Assessment:   79 y.o. male with bladder spasm   Recommendations: #bladder spasm  UA color contaminated by Azo. Possibly mild UTI, but otherwise unremarkable. Continue emperic ABX. PRN Hyoscyamine  overnight. TOV in the AM and discharge by midday.  Appreciate hospitalist assistance.   Case and plan discussed with Dr. Cam  Past Medical History: Past Medical History:  Diagnosis Date   Borderline hypertension    no meds   BPH (benign prostatic hyperplasia)    Diastasis recti 05/19/2019   Erectile dysfunction 10/04/2015   Essential hypertension 04/05/2016   Grover's disease    Hemangioma of skin and subcutaneous tissue    Hyperlipidemia    Lentigo    Mild cognitive impairment of uncertain or unknown etiology 08/27/2023   Other seborrheic keratosis    Primary parkinsonism 08/07/2023   Prostate cancer 2013   moderate risk T1c, Gleason 3+3, PSA 5.75, vol 57ml -- planned Radioactive seed implants 01-26-2016   Pure hypercholesterolemia 10/04/2015   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    Stage 3a chronic kidney disease 03/06/2020   Tremors of nervous system 03/01/2021   Wears glasses     Past Surgical History:  Past Surgical History:  Procedure Laterality Date   APPENDECTOMY  teen   COLONOSCOPY  last one 07/ 2017   INGUINAL HERNIA REPAIR Left 1993   KNEE ARTHROSCOPY Left last one -- left 01/ 2017   PROSTATE BIOPSY     multiple   RADIOACTIVE SEED IMPLANT N/A 01/26/2016   Procedure: RADIOACTIVE SEED IMPLANT/BRACHYTHERAPY IMPLANT;  Surgeon: Ricardo Alvaro, MD;  Location: [facility];  Service: Urology;  Laterality: N/A;   SHOULDER ARTHROSCOPY WITH OPEN ROTATOR CUFF REPAIR Left early 2000's   TONSILLECTOMY  child    Medication: Current Facility-Administered Medications  Medication Dose Route Frequency Provider Last Rate Last Admin   0.9 %  sodium chloride  infusion (Manually program via Guardrails IV Fluids)   Intravenous Once Celinda Alm Lot, MD       0.9 %  sodium chloride  infusion   Intravenous Continuous Celinda Alm Lot, MD       acetaminophen  (TYLENOL ) tablet 650 mg  650 mg Oral Q6H PRN Celinda Alm Lot, MD       Or   acetaminophen  (TYLENOL ) suppository 650 mg  650 mg Rectal Q6H PRN Celinda Alm Lot, MD       cefTRIAXone  (ROCEPHIN ) 1 g in sodium chloride  0.9 % 100 mL IVPB  1 g Intravenous Q24H Celinda Alm Lot, MD       ondansetron  (ZOFRAN ) tablet 4 mg  4 mg Oral Q6H PRN Celinda Alm Lot,  MD       Or   ondansetron  (ZOFRAN ) injection 4 mg  4 mg Intravenous Q6H PRN Celinda Alm Lot, MD       oxyCODONE  (Oxy IR/ROXICODONE ) immediate release tablet 5 mg  5 mg Oral Q6H PRN Celinda Alm Lot, MD        Allergies: Allergies[1]  Social History: Social History[2]  Family History Family History  Problem Relation Age of Onset   Alzheimer's disease Mother        late-onset   Lung cancer Father    Breast cancer Sister    Healthy Child    Parkinson's disease Neg Hx    Tremor Neg Hx     Review of  Systems  Genitourinary:  Negative for dysuria, flank pain, frequency, hematuria and urgency.     Objective   Vital signs in last 24 hours: BP 139/75 (BP Location: Left Arm)   Pulse 71   Temp 98.5 F (36.9 C) (Oral)   Resp 16   Ht 5' 8 (1.727 m)   Wt 73 kg   SpO2 97%   BMI 24.47 kg/m   Physical Exam General: A&O, resting, appropriate HEENT: [place]/AT Pulmonary: Normal work of breathing Cardiovascular: no cyanosis GU: Foley catheter in place draining clear orange urine. Small flecks of old blood. No clots present.   Most Recent Labs: Lab Results  Component Value Date   WBC 7.0 06/02/2024   HGB 6.8 (LL) 06/02/2024   HCT 19.6 (L) 06/02/2024   PLT 185 06/02/2024    Lab Results  Component Value Date   NA 132 (L) 06/02/2024   K 4.4 06/02/2024   CL 99 06/02/2024   CO2 22 06/02/2024   BUN 38 (H) 06/02/2024   CREATININE 3.16 (H) 06/02/2024   CALCIUM  8.8 (L) 06/02/2024    Lab Results  Component Value Date   INR 1.1 06/02/2024   APTT 25 01/19/2016     Urine Culture: @[id number] (laburin,org,[id number],[id number])@   IMAGING: No results found.  ------  Ole Bourdon, NP Pager: [phone]   Please contact the urology consult pager with any further questions/concerns.     [1]  Allergies Allergen Reactions   Lisinopril Other (See Comments)    Or tickle in throat  [2]  Social History Tobacco Use   Smoking status: Never   Smokeless tobacco: Never  Vaping Use   Vaping status: Never Used  Substance Use Topics   Alcohol use: No   Drug use: No   "

## 2024-06-02 NOTE — H&P (Signed)
 " History and Physical    Patient: William Lindsey FMW:[id number] DOB: 01/19/1946 DOA: 06/01/2024 DOS: the patient was seen and examined on 06/02/2024 PCP: Anita Bernardino BROCKS, FNP  Patient coming from: Home  Chief Complaint:  Chief Complaint  Patient presents with   Hematuria    Post cysto   HPI: William Lindsey is a 79 y.o. male with medical history significant of borderline hypertension, BPH, prostate cancer, diastasis recti, rectal dysfunction, Grovers disease, skin hemangioma, hyperlipidemia, lentigo, mild cognitive impairment, several radiodermatitis, primary parkinsonism, RBBB and left anterior fascicular block, stage IIIa CKD who underwent a cystoscopy yesterday, but went to the [facility] emergency department later in the evening due to pain, gross hematuria and urinary frequency.  No flank pain.  He has felt tired, but denied fever, chills, rhinorrhea, sore throat, wheezing or hemoptysis.  No chest pain, palpitations, diaphoresis, PND, orthopnea or pitting edema of the lower extremities.  No abdominal pain, nausea, emesis, diarrhea, constipation, melena or hematochezia.  No polyuria, polydipsia, polyphagia or blurred vision.   Lab work: Urinalysis was orange in color with glucose of 250 and protein greater than 300 mg/dL.  Positive large hemoglobin, small bilirubin, trace ketones, positive nitrites and trace leukocyte esterase.  There was rare bacteria microscopic examination and 11-20 WBC.  CBC showed white count 6.5, hemoglobin 6.9 g/dL and platelets 818.  Normal PT and INR.  CMP showed a sodium 129, potassium 4.2, chloride 96 and CO2 20 mmol/L with a normal anion gap.  Glucose 98, BUN 39, creatinine 3.13 mg/dL.  Albumin was 3.4 g/dL and AST 12 units/L, the rest of the LFTs and calcium  after correction were normal.  ED course: Initial vital signs were temperature 98.6 F, pulse 92, respirations 17, BP 160/88 mmHg and O2 sat 97% on room air.  The patient received ceftriaxone  1 g  IVPB, fentanyl  50 mcg IVP, morphine  4 mg IVP x 2, oxybutynin  5 mg p.o and Percocet 5/325 mg 1 tablet p.o. x 1.   Review of Systems: As mentioned in the history of present illness. All other systems reviewed and are negative. Past Medical History:  Diagnosis Date   Borderline hypertension    no meds   BPH (benign prostatic hyperplasia)    Diastasis recti 05/19/2019   Erectile dysfunction 10/04/2015   Essential hypertension 04/05/2016   Grover's disease    Hemangioma of skin and subcutaneous tissue    Hyperlipidemia    Lentigo    Mild cognitive impairment of uncertain or unknown etiology 08/27/2023   Other seborrheic keratosis    Primary parkinsonism 08/07/2023   Prostate cancer 2013   moderate risk T1c, Gleason 3+3, PSA 5.75, vol 57ml -- planned Radioactive seed implants 01-26-2016   Pure hypercholesterolemia 10/04/2015   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    Stage 3a chronic kidney disease 03/06/2020   Tremors of nervous system 03/01/2021   Wears glasses    Past Surgical History:  Procedure Laterality Date   APPENDECTOMY  teen   COLONOSCOPY  last one 07/ 2017   INGUINAL HERNIA REPAIR Left 1993   KNEE ARTHROSCOPY Left last one -- left 01/ 2017   PROSTATE BIOPSY     multiple   RADIOACTIVE SEED IMPLANT N/A 01/26/2016   Procedure: RADIOACTIVE SEED IMPLANT/BRACHYTHERAPY IMPLANT;  Surgeon: Ricardo Likens, MD;  Location: [facility];  Service: Urology;  Laterality: N/A;   SHOULDER ARTHROSCOPY WITH OPEN ROTATOR CUFF REPAIR Left early 2000's   TONSILLECTOMY  child  Social History:  reports that he has never smoked. He has never used smokeless tobacco. He reports that he does not drink alcohol and does not use drugs.  Allergies[1]  Family History  Problem Relation Age of Onset   Alzheimer's disease Mother        late-onset   Lung cancer Father    Breast cancer Sister    Healthy Child    Parkinson's disease Neg Hx    Tremor Neg  Hx     Prior to Admission medications  Medication Sig Start Date End Date Taking? Authorizing Provider  aspirin EC 325 MG tablet Take 325 mg by mouth daily.   Yes [provider]  atorvastatin  (LIPITOR) 20 MG tablet Take 20 mg by mouth daily.   Yes [provider]  Calcium  Carb-Cholecalciferol  (OYSTER SHELL CALCIUM  W/D) 500-5 MG-MCG TABS Take 1 tablet by mouth 2 (two) times daily.   Yes [provider]  carbidopa -levodopa  (SINEMET  IR) 25-100 MG tablet 2 tablet at 6:30/1 at 10:30/2 at 2:30/1 at 6:30 02/18/24  Yes Tat, Asberry RAMAN, DO  ferrous sulfate  325 (65 FE) MG tablet Take 325 mg by mouth daily before breakfast.   Yes [provider]  LYCOPENE PO Take 1 tablet by mouth daily.   Yes [provider]  Multiple Vitamins-Minerals (PRESERVISION AREDS PO) Take 1 tablet by mouth 2 (two) times daily. Focal   Yes [provider]  nitroGLYCERIN  (NITROSTAT ) 0.4 MG SL tablet Place 0.4 mg under the tongue every 5 (five) minutes as needed for chest pain. 04/30/24 04/30/25 Yes [provider]  losartan (COZAAR) 50 MG tablet Take 1 tablet daily for blood pressure control. Patient not taking: Reported on 06/02/2024 09/11/17   [provider]    Physical Exam: Vitals:   06/02/24 1100 06/02/24 1200 06/02/24 1330 06/02/24 1415  BP: 110/62 124/69 113/75 139/75  Pulse: 76 68 71 71  Resp: 14 14 12 16   Temp:    98.5 F (36.9 C)  TempSrc:    Oral  SpO2: 94% 96% 95% 97%  Weight:    73 kg  Height:    5' 8 (1.727 m)   Physical Exam Vitals and nursing note reviewed.  Constitutional:      General: He is awake. He is not in acute distress.    Appearance: Normal appearance. He is ill-appearing.  HENT:     Head: Normocephalic.     Nose: No rhinorrhea.     Mouth/Throat:     Mouth: Mucous membranes are moist.  Eyes:     General: No scleral icterus.    Pupils: Pupils are equal, round, and reactive to light.  Neck:     Vascular: No JVD.   Cardiovascular:     Rate and Rhythm: Normal rate and regular rhythm.     Heart sounds: S1 normal and S2 normal.  Pulmonary:     Effort: Pulmonary effort is normal.     Breath sounds: Normal breath sounds. No wheezing, rhonchi or rales.  Abdominal:     General: Bowel sounds are normal. There is no distension.     Palpations: Abdomen is soft.     Tenderness: There is no abdominal tenderness. There is no right CVA tenderness or left CVA tenderness.  Musculoskeletal:     Cervical back: Neck supple.     Right lower leg: No edema.     Left lower leg: No edema.  Skin:    General: Skin is warm and dry.  Neurological:  General: No focal deficit present.     Mental Status: He is alert and oriented to person, place, and time.  Psychiatric:        Mood and Affect: Mood normal.        Behavior: Behavior normal. Behavior is cooperative.    Data Reviewed:  Results are pending, will review when available.  Assessment and Plan: Principal Problem:   Gross hematuria Associated with:   ABLA (acute blood loss anemia) After having cystoscopy earlier today. Admit to PCU/inpatient. Continue IV fluids. Continue pantoprazole infusion. Monitor H&H. Transfuse 1 unit of PRBC. Transfuse further as needed. Continue ceftriaxone  for UTI coverage. Urology consult appreciated.  Active Problems:   Stage 3a chronic kidney disease With worsening creatinine. PRBC transfusion. Gentle IV hydration. Monitor intake and output. Follow-up renal function in AM.    Prostate cancer Following with urology.    Essential hypertension Currently off antihypertensives.    Primary parkinsonism Continue Sinemet  4 times a day.    Hyperlipidemia Continue atorvastatin  20 mg p.o. daily.     Advance Care Planning:   Code Status: Full Code   Consults:  Urology Charissa Salines, MD).  Family Communication: His spouse was at bedside.  Severity of Illness: The appropriate patient status for this patient  is INPATIENT. Inpatient status is judged to be reasonable and necessary in order to provide the required intensity of service to ensure the patient's safety. The patient's presenting symptoms, physical exam findings, and initial radiographic and laboratory data in the context of their chronic comorbidities is felt to place them at high risk for further clinical deterioration. Furthermore, it is not anticipated that the patient will be medically stable for discharge from the hospital within 2 midnights of admission.   * I certify that at the point of admission it is my clinical judgment that the patient will require inpatient hospital care spanning beyond 2 midnights from the point of admission due to high intensity of service, high risk for further deterioration and high frequency of surveillance required.*  Author: Alm Dorn Castor, MD 06/02/2024 3:59 PM  For on call review [email or web address].   This document was prepared using Dragon voice recognition software and may contain some unintended transcription errors.     [1]  Allergies Allergen Reactions   Lisinopril Other (See Comments)    Or tickle in throat   "

## 2024-06-02 NOTE — ED Notes (Signed)
 Purple man Green.

## 2024-06-02 NOTE — ED Notes (Signed)
 Report given to [facility].

## 2024-06-02 NOTE — ED Notes (Signed)
 George from [facility] called for transport 13:12-TC

## 2024-06-02 NOTE — Progress Notes (Signed)
" ° °  Brief Progress Note   _____________________________________________________________________________________________________________  Patient Name: William Lindsey Patient DOB: 1945-07-23 Date: @TODAY @      Data: Reviewed labs, notes, VS.      Action: No action needed at this time.      Response:    _____________________________________________________________________________________________________________  [facility] RN Expeditor Sharolyn JONETTA Batman Please contact us  directly via secure chat (search for [facility]) or by calling us  at [phone] [facility]).  "

## 2024-06-02 NOTE — Progress Notes (Signed)
 Plan of Care Note for accepted transfer   Patient: William Lindsey MRN: [id number]   DOA: 06/01/2024  Facility requesting transfer: [facility]   Requesting Provider: Dr. Roselyn   Reason for transfer: Pain and gross hematuria after cystoscopy, acute on chronic anemia   Facility course: 79 yr old man with HTN, HLD, and prostate cancer who is following with nephrology for AKI on CKD 3 vs progression of CKD and had cystoscopy 1/12 who presents with pain, dysuria, and gross hematuria following the procedure.   Serum sodium is 129, SCr 3.13 (3.56 on January 9th), and Hgb 6.9 (8.1 on January 9th).   Foley cathter was placed and he was treated with 2 doses of Percocet. ED physician consulted with urology who recommended empiric antibiotics.   Plan of care: The patient is accepted for admission to Progressive unit, at [facility].   Author: Evalene GORMAN Sprinkles, MD 06/02/2024  Check [email or web address] for on-call coverage.  Nursing staff, Please call [facility] Admits & Consults System-Wide number on Amion as soon as patient's arrival, so appropriate admitting provider can evaluate the pt.

## 2024-06-03 ENCOUNTER — Observation Stay (HOSPITAL_COMMUNITY)

## 2024-06-03 DIAGNOSIS — E785 Hyperlipidemia, unspecified: Secondary | ICD-10-CM | POA: Diagnosis not present

## 2024-06-03 DIAGNOSIS — Z8546 Personal history of malignant neoplasm of prostate: Secondary | ICD-10-CM | POA: Diagnosis not present

## 2024-06-03 DIAGNOSIS — I129 Hypertensive chronic kidney disease with stage 1 through stage 4 chronic kidney disease, or unspecified chronic kidney disease: Secondary | ICD-10-CM | POA: Diagnosis not present

## 2024-06-03 DIAGNOSIS — E875 Hyperkalemia: Secondary | ICD-10-CM | POA: Diagnosis not present

## 2024-06-03 DIAGNOSIS — R310 Gross hematuria: Secondary | ICD-10-CM | POA: Diagnosis not present

## 2024-06-03 DIAGNOSIS — G20C Parkinsonism, unspecified: Secondary | ICD-10-CM

## 2024-06-03 DIAGNOSIS — I10 Essential (primary) hypertension: Secondary | ICD-10-CM | POA: Diagnosis not present

## 2024-06-03 DIAGNOSIS — N179 Acute kidney failure, unspecified: Secondary | ICD-10-CM | POA: Diagnosis not present

## 2024-06-03 DIAGNOSIS — Z7982 Long term (current) use of aspirin: Secondary | ICD-10-CM | POA: Diagnosis not present

## 2024-06-03 DIAGNOSIS — C61 Malignant neoplasm of prostate: Secondary | ICD-10-CM

## 2024-06-03 DIAGNOSIS — N184 Chronic kidney disease, stage 4 (severe): Secondary | ICD-10-CM | POA: Diagnosis not present

## 2024-06-03 DIAGNOSIS — N1831 Chronic kidney disease, stage 3a: Secondary | ICD-10-CM

## 2024-06-03 DIAGNOSIS — D62 Acute posthemorrhagic anemia: Secondary | ICD-10-CM

## 2024-06-03 LAB — TYPE AND SCREEN
ABO/RH(D): A POS
Antibody Screen: NEGATIVE
Unit division: 0

## 2024-06-03 LAB — URINE CULTURE: Culture: NO GROWTH

## 2024-06-03 LAB — COMPREHENSIVE METABOLIC PANEL WITH GFR
ALT: <5 U/L (ref 0–44)
AST: 12 U/L — ABNORMAL LOW (ref 15–41)
Albumin: 3.2 g/dL — ABNORMAL LOW (ref 3.5–5.0)
Alkaline Phosphatase: 55 U/L (ref 38–126)
Anion gap: 9 (ref 5–15)
BUN: 37 mg/dL — ABNORMAL HIGH (ref 8–23)
CO2: 22 mmol/L (ref 22–32)
Calcium: 8.9 mg/dL (ref 8.9–10.3)
Chloride: 107 mmol/L (ref 98–111)
Creatinine, Ser: 3.51 mg/dL — ABNORMAL HIGH (ref 0.61–1.24)
GFR, Estimated: 17 mL/min — ABNORMAL LOW
Glucose, Bld: 92 mg/dL (ref 70–99)
Potassium: 4.9 mmol/L (ref 3.5–5.1)
Sodium: 138 mmol/L (ref 135–145)
Total Bilirubin: 0.3 mg/dL (ref 0.0–1.2)
Total Protein: 8.3 g/dL — ABNORMAL HIGH (ref 6.5–8.1)

## 2024-06-03 LAB — CBC
HCT: 23.4 % — ABNORMAL LOW (ref 39.0–52.0)
Hemoglobin: 7.9 g/dL — ABNORMAL LOW (ref 13.0–17.0)
MCH: 32.9 pg (ref 26.0–34.0)
MCHC: 33.8 g/dL (ref 30.0–36.0)
MCV: 97.5 fL (ref 80.0–100.0)
Platelets: 195 K/uL (ref 150–400)
RBC: 2.4 MIL/uL — ABNORMAL LOW (ref 4.22–5.81)
RDW: 13.3 % (ref 11.5–15.5)
WBC: 7.6 K/uL (ref 4.0–10.5)
nRBC: 0 % (ref 0.0–0.2)

## 2024-06-03 LAB — BPAM RBC
Blood Product Expiration Date: 202602132359
ISSUE DATE / TIME: 202601132138
Unit Type and Rh: 6200

## 2024-06-03 LAB — URINALYSIS, COMPLETE (UACMP) WITH MICROSCOPIC
Bacteria, UA: NONE SEEN
Bilirubin Urine: NEGATIVE
Glucose, UA: NEGATIVE mg/dL
Ketones, ur: NEGATIVE mg/dL
Leukocytes,Ua: NEGATIVE
Nitrite: NEGATIVE
Protein, ur: NEGATIVE mg/dL
RBC / HPF: >50 RBC/hpf (ref 0–5)
Specific Gravity, Urine: 1.011 (ref 1.005–1.030)
pH: 5 (ref 5.0–8.0)

## 2024-06-03 LAB — SODIUM, URINE, RANDOM: Sodium, Ur: 126 mmol/L

## 2024-06-03 LAB — CREATININE, URINE, RANDOM: Creatinine, Urine: 87 mg/dL

## 2024-06-03 MED ORDER — SODIUM CHLORIDE 0.9 % IV SOLN: INTRAVENOUS | Status: DC

## 2024-06-03 MED ORDER — OXYBUTYNIN CHLORIDE 5 MG PO TABS
5.0000 mg | ORAL_TABLET | Freq: Three times a day (TID) | ORAL | Status: DC
  Administered 2024-06-03: 5 mg via ORAL
  Filled 2024-06-03 (×2): qty 1

## 2024-06-03 MED ORDER — BELLADONNA ALKALOIDS-OPIUM 16.2-60 MG RE SUPP
1.0000 | Freq: Four times a day (QID) | RECTAL | Status: DC | PRN
  Administered 2024-06-03: 1 via RECTAL
  Filled 2024-06-03: qty 1

## 2024-06-03 NOTE — Progress Notes (Signed)
 Worsening AKI with return of spasm. Clear urine. Changing hyoscyamine  to B&O suppositories and schedule Ditropan . CT stone pending

## 2024-06-03 NOTE — Care Management Obs Status (Signed)
 MEDICARE OBSERVATION STATUS NOTIFICATION   Patient Details  Name: William Lindsey MRN: [id number] Date of Birth: 07-08-1945   Medicare Observation Status Notification Given:  Yes    Duwaine GORMAN Aran, LCSW 06/03/2024, 9:39 AM

## 2024-06-03 NOTE — Progress Notes (Signed)
" °   06/03/24 1005  TOC Brief Assessment  Insurance and Status Reviewed  Patient has primary care physician Yes  Home environment has been reviewed Resides in single family home with spouse  Prior level of function: Independent with ADLs at baseline  Prior/Current Home Services No current home services  Social Drivers of Health Review SDOH reviewed no interventions necessary  Readmission risk has been reviewed Yes  Transition of care needs no transition of care needs at this time    "

## 2024-06-03 NOTE — Progress Notes (Signed)
 Pt shared recently being established with nephrology. Care Everywhere note with [facility] 12/23 regarding SCr if 3.1 without clear etiology. Pending follow up. CT A/P from today unremarkable. Foley in place and draining this am. Found to be obstructed this afternoon. Flushed with return of . Removed foley. Agree with IV fluids

## 2024-06-03 NOTE — Telephone Encounter (Signed)
 Follow up call after patient missed appointment 06/01/2024. Unfortunately he has been admitted to hospital for hematuria. He says he is feeling better today, has received blood transfusion. Unfortunately no notes or labs available in Care Everywhere. He will stay in touch and we can reschedule appt for follow up.

## 2024-06-03 NOTE — Care Management CC44 (Signed)
"         Condition Code 44 Documentation Completed  Patient Details  Name: BRAUN ROCCA MRN: [id number] Date of Birth: 05/16/1946   Condition Code 44 given:  Yes Patient signature on Condition Code 44 notice:  Yes Documentation of 2 MD's agreement:  Yes Code 44 added to claim:  Yes    Duwaine GORMAN Aran, LCSW 06/03/2024, 9:39 AM  "

## 2024-06-03 NOTE — Progress Notes (Signed)
" ° °  °  Subjective: No acute events overnight.  Spasms are well-controlled and no evidence of hematuria.  Objective: Vital signs in last 24 hours: Temp:  [98.4 F (36.9 C)-99.2 F (37.3 C)] 98.7 F (37.1 C) (01/14 0639) Pulse Rate:  [68-86] 86 (01/14 0639) Resp:  [12-20] 18 (01/14 0639) BP: (110-147)/(62-85) 145/85 (01/14 0639) SpO2:  [94 %-97 %] 95 % (01/14 0639) Weight:  [73 kg] 73 kg (01/13 1415)  Assessment/Plan: 79 year old male with significant bladder spasm and concern for hematuria after surveillance cystoscopy in clinic.  Followed by Dr. Alvaro  # Bladder spasm # AKI  No clear etiology for AKI.  Initially suspected to be secondary to obstruction but after 24 hours with Foley catheter, patient has worsened slightly.  No evidence of hematuria, bladder spasm well-controlled.  Collecting CT stone this morning.  Intake/Output from previous day: 01/13 0701 - 01/14 0700 In: 1895.3 [P.O.:480; I.V.:82.8; Blood:332.5; IV Piggyback:1000] Out: 4050 [Urine:4050]  Intake/Output this shift: No intake/output data recorded.  Physical Exam:  General: Alert and oriented CV: No cyanosis Lungs: equal chest rise Gu: Foley catheter in place draining clear yellow urine  Lab Results: Recent Labs    06/02/24 1531 06/02/24 2010 06/03/24 0551  HGB 7.6* 7.5* 7.9*  HCT 22.3* 21.4* 23.4*   BMET Recent Labs    06/02/24 0530 06/02/24 1531 06/02/24 2010 06/03/24 0551  NA 132*  --   --  138  K 4.4  --   --  4.9  CL 99  --   --  107  CO2 22  --   --  22  GLUCOSE 97  --   --  92  BUN 38*  --   --  37*  CREATININE 3.16*  --   --  3.51*  CALCIUM  8.8*  --   --  8.9  HGB 6.8*   < > 7.5* 7.9*  WBC 7.0  --   --  7.6   < > = values in this interval not displayed.     Studies/Results: No results found.    LOS: 1 day   Ole Bourdon, NP [facility] Pager: [phone]  06/03/2024, 10:14 AM   "

## 2024-06-03 NOTE — Progress Notes (Signed)
 " PROGRESS NOTE    William Lindsey  FMW:[id number] DOB: 02/05/1946 DOA: 06/01/2024 PCP: Anita Bernardino BROCKS, FNP    Chief Complaint  Patient presents with   Hematuria    Post cysto    Brief Narrative:  Patient 79 year old gentleman history of borderline hypertension, BPH, prostate cancer, diastases recti, rectal dysfunction, Grovers disease, skin hemangioma, hyperlipidemia, mild cognitive impairment, CKD stage IIIa noted to have undergone cystoscopy 1 day prior to admission presenting to ED with pain, gross hematuria, urinary frequency.  Urinalysis done with concerns for acute cystitis.  Patient also noted with a hemoglobin of 6.9.  Patient with a creatinine of 3.13 on presentation.  Patient seen in consultation by urology, urine cultures obtained, patient placed empirically on IV Rocephin .  Patient transfused 2 unit of PRBCs.   Assessment & Plan:   Principal Problem:   Gross hematuria Active Problems:   Prostate cancer   Essential hypertension   Primary parkinsonism   Stage 3a chronic kidney disease   Hyperlipidemia   ABLA (acute blood loss anemia)   AKI (acute kidney injury)  #1 gross hematuria/acute blood loss anemia -Patient noted to have developed gross hematuria, bladder spasms after cystoscopy done in the outpatient setting. - Patient underwent CBI. - Patient seen in consultation by urology. - Patient status posttransfusion 1 unit PRBCs with hemoglobin currently at 7.9 from 6.8. - Gross hematuria improved. - CT renal stone protocol obtained per urology today that showed mild haziness along the ventral margin of the bladder may be due to cystitis, tiny right renal stone, small bilateral pleural effusions, aortic atherosclerosis, coronary artery calcification. - Patient noted with intense bladder spasms today and hyoscyamine  changed to B and O suppositories and scheduled Ditropan  per urology. - Patient to undergo voiding trial. - Per urology.  2.  AKI on CKD stage IIIa -Patient  with worsening renal function with creatinine at 3.51 today from 3.13 on admission. - Status post transfusion 1 unit PRBCs. - Urinalysis done on admission with hematuria, nitrite positive, trace leukocytes, 11-20 WBCs. - Urine cultures negative. - CT renal stone protocol done earlier on today negative for hydronephrosis. - Per urology Foley catheter found to be obstructed this afternoon and flushed with return of 600 cc, Foley catheter removed and removed. - Repeat urinalysis done with negative protein, nitrite negative, leukocytes negative, no bacteria seen. - Urine sodium noted at 126, urine creatinine at 87. - Concern for prerenal azotemia versus postrenal azotemia as Foley catheter noted to be obstructed this afternoon. - Hydrate with IV fluids for the next 24 hours. - I/os. - Repeat labs in the AM.  3.?  UTI -Patient presented with gross hematuria after recent instrumentation with cystoscopy. - Urine cultures obtained negative. - Continue empiric IV Rocephin  and treat for total of 3 to 5 days.  4.  History of prostate cancer -Per urology.  5.  Hypertension -Currently off antihypertensive medications. - Follow.  6.  Primary parkinsonism -Continue Sinemet .  7.  Hyperlipidemia -Statin.   DVT prophylaxis: SCDs Code Status: Full Family Communication: Updated patient.  No family at bedside. Disposition: Likely home when clinically improved and cleared by urology.  Status is: Observation The patient remains OBS appropriate and will d/c before 2 midnights.   Consultants:  Urology: Dr. Cam 06/02/2024  Procedures: CT renal stone protocol 06/03/2024 Transfusion 1 unit PRBCs 06/02/2024  Antimicrobials:  Anti-infectives (From admission, onward)    Start     Dose/Rate Route Frequency Ordered Stop   06/02/24 2200  cefTRIAXone  (ROCEPHIN )  1 g in sodium chloride  0.9 % 100 mL IVPB        1 g 200 mL/hr over 30 Minutes Intravenous Every 24 hours 06/02/24 1432     06/02/24 0215   cefTRIAXone  (ROCEPHIN ) 1 g in sodium chloride  0.9 % 100 mL IVPB        1 g 200 mL/hr over 30 Minutes Intravenous  Once 06/02/24 0200 06/02/24 0316         Subjective: Patient on the way to CT renal stone protocol.  Denies any chest pain or shortness of breath.  Patient with complaints of worsening bladder spasms.  Denies any further hematuria.  Objective: Vitals:   06/03/24 0000 06/03/24 0244 06/03/24 0639 06/03/24 1340  BP: 137/80 (!) 147/68 (!) 145/85 (!) 146/89  Pulse: 78 82 86 72  Resp: 16 18 18    Temp: 99 F (37.2 C) 98.8 F (37.1 C) 98.7 F (37.1 C) 98.5 F (36.9 C)  TempSrc: Oral   Oral  SpO2: 95% 95% 95% 98%  Weight:      Height:        Intake/Output Summary (Last 24 hours) at 06/03/2024 2150 Last data filed at 06/03/2024 1848 Gross per 24 hour  Intake 1115.52 ml  Output 2150 ml  Net -1034.48 ml   Filed Weights   06/02/24 1415  Weight: 73 kg    Examination:  General exam: Appears calm and comfortable  Respiratory system: Clear to auscultation. Respiratory effort normal. Cardiovascular system: S1 & S2 heard, RRR. No JVD, murmurs, rubs, gallops or clicks. No pedal edema. Gastrointestinal system: Abdomen is nondistended, soft and nontender. No organomegaly or masses felt. Normal bowel sounds heard. Central nervous system: Alert and oriented. No focal neurological deficits. Extremities: Symmetric 5 x 5 power. Skin: No rashes, lesions or ulcers Psychiatry: Judgement and insight appear normal. Mood & affect appropriate.     Data Reviewed: I have personally reviewed following labs and imaging studies  CBC: Recent Labs  Lab 06/02/24 0045 06/02/24 0530 06/02/24 1531 06/02/24 2010 06/03/24 0551  WBC 6.5 7.0  --   --  7.6  NEUTROABS 4.8 4.7  --   --   --   HGB 6.9* 6.8* 7.6* 7.5* 7.9*  HCT 19.9* 19.6* 22.3* 21.4* 23.4*  MCV 95.7 96.6  --   --  97.5  PLT 181 185  --   --  195    Basic Metabolic Panel: Recent Labs  Lab 06/02/24 0045 06/02/24 0530  06/03/24 0551  NA 129* 132* 138  K 4.2 4.4 4.9  CL 96* 99 107  CO2 20* 22 22  GLUCOSE 98 97 92  BUN 39* 38* 37*  CREATININE 3.13* 3.16* 3.51*  CALCIUM  8.4* 8.8* 8.9    GFR: Estimated Creatinine Clearance: 16.5 mL/min (A) (by C-G formula based on SCr of 3.51 mg/dL (H)).  Liver Function Tests: Recent Labs  Lab 06/02/24 0045 06/03/24 0551  AST 12* 12*  ALT <5 <5  ALKPHOS 52 55  BILITOT 0.6 0.3  PROT 8.1 8.3*  ALBUMIN 3.4* 3.2*    CBG: No results for input(s): GLUCAP in the last 168 hours.   Recent Results (from the past 240 hours)  Urine Culture     Status: None   Collection Time: 06/01/24 10:24 PM   Specimen: Urine, Clean Catch  Result Value Ref Range Status   Specimen Description   Final    URINE, CLEAN CATCH Performed at [facility], [address] [place], [place] [address]  Special Requests   Final    NONE Performed at [facility], [address] [place], [place] [address]    Culture   Final    NO GROWTH Performed at [facility], [address] [place], [place] [address]    Report Status 06/03/2024 FINAL  Final         Radiology Studies: CT RENAL STONE STUDY Result Date: 06/03/2024 CLINICAL DATA:  Worsening kidney function following cystoscopy. Bladder spasms and hematuria. EXAM: CT ABDOMEN AND PELVIS WITHOUT CONTRAST TECHNIQUE: Multidetector CT imaging of the abdomen and pelvis was performed following the standard protocol without IV contrast. RADIATION DOSE REDUCTION: This exam was performed according to the departmental dose-optimization program which includes automated exposure control, adjustment of the mA and/or kV according to patient size and/or use of iterative reconstruction technique. COMPARISON:  05/20/2024. FINDINGS: Lower chest: Small bilateral pleural effusions with mild dependent atelectasis bilaterally. Atherosclerotic calcification of the aorta and coronary arteries. Heart is  enlarged. No pericardial effusion. Air in the lower esophagus be seen with dysmotility. Slight thickening of the distal esophagus can be seen with gastroesophageal reflux disease. Hepatobiliary: Liver and gallbladder are unremarkable. No biliary ductal dilatation. Pancreas: Negative. Spleen: Negative. Adrenals/Urinary Tract: Adrenal glands are unremarkable. Tiny right renal stone. Kidneys are otherwise unremarkable. Ureters are decompressed. Foley catheter and air in the bladder. There is mild perivesical inflammatory haziness and stranding along the ventral margin. Stomach/Bowel: Tiny hiatal hernia. Stomach, small bowel, appendix and colon are unremarkable. Vascular/Lymphatic: Circumaortic left renal vein. Atherosclerotic calcification of the aorta. No pathologically enlarged lymph nodes. Reproductive: Brachytherapy seeds in the prostate. Other: No free fluid. Musculoskeletal: Osteopenia. Degenerative changes in the spine. Mild levoconvex scoliosis. IMPRESSION: 1. Mild haziness along the ventral margin of the bladder may be due to cystitis. 2. Tiny right renal stone. 3. Small bilateral pleural effusions. 4. Aortic atherosclerosis (ICD10-I70.0). Coronary artery calcification. Electronically Signed   By: Newell Eke M.D.   On: 06/03/2024 14:06        Scheduled Meds:  atorvastatin   20 mg Oral Daily   calcium -vitamin D  1 tablet Oral BID   carbidopa -levodopa   2 tablet Oral 2 times per day   And   carbidopa -levodopa   1 tablet Oral 2 times per day   ferrous sulfate   325 mg Oral QAC breakfast   Continuous Infusions:  sodium chloride  125 mL/hr at 06/03/24 1450   cefTRIAXone  (ROCEPHIN )  IV 1 g (06/03/24 0011)     LOS: 1 day    Time spent: 40 minutes    Toribio Hummer, MD [facility]   To contact the attending provider between 7A-7P or the covering provider during after hours 7P-7A, please log into the web site [email or web address] and access using universal [place] password for that  web site. If you do not have the password, please call the hospital operator.  06/03/2024, 9:50 PM    "

## 2024-06-04 DIAGNOSIS — N179 Acute kidney failure, unspecified: Secondary | ICD-10-CM

## 2024-06-04 DIAGNOSIS — D62 Acute posthemorrhagic anemia: Secondary | ICD-10-CM | POA: Diagnosis not present

## 2024-06-04 DIAGNOSIS — C61 Malignant neoplasm of prostate: Secondary | ICD-10-CM | POA: Diagnosis not present

## 2024-06-04 DIAGNOSIS — I10 Essential (primary) hypertension: Secondary | ICD-10-CM | POA: Diagnosis not present

## 2024-06-04 DIAGNOSIS — N184 Chronic kidney disease, stage 4 (severe): Secondary | ICD-10-CM

## 2024-06-04 DIAGNOSIS — R310 Gross hematuria: Secondary | ICD-10-CM | POA: Diagnosis not present

## 2024-06-04 DIAGNOSIS — E875 Hyperkalemia: Secondary | ICD-10-CM

## 2024-06-04 LAB — CBC
HCT: 23.1 % — ABNORMAL LOW (ref 39.0–52.0)
Hemoglobin: 7.7 g/dL — ABNORMAL LOW (ref 13.0–17.0)
MCH: 33 pg (ref 26.0–34.0)
MCHC: 33.3 g/dL (ref 30.0–36.0)
MCV: 99.1 fL (ref 80.0–100.0)
Platelets: 189 K/uL (ref 150–400)
RBC: 2.33 MIL/uL — ABNORMAL LOW (ref 4.22–5.81)
RDW: 13.2 % (ref 11.5–15.5)
WBC: 6.9 K/uL (ref 4.0–10.5)
nRBC: 0 % (ref 0.0–0.2)

## 2024-06-04 LAB — RENAL FUNCTION PANEL
Albumin: 3.1 g/dL — ABNORMAL LOW (ref 3.5–5.0)
Anion gap: 7 (ref 5–15)
BUN: 35 mg/dL — ABNORMAL HIGH (ref 8–23)
CO2: 24 mmol/L (ref 22–32)
Calcium: 8.7 mg/dL — ABNORMAL LOW (ref 8.9–10.3)
Chloride: 109 mmol/L (ref 98–111)
Creatinine, Ser: 3.31 mg/dL — ABNORMAL HIGH (ref 0.61–1.24)
GFR, Estimated: 18 mL/min — ABNORMAL LOW
Glucose, Bld: 118 mg/dL — ABNORMAL HIGH (ref 70–99)
Phosphorus: 4.6 mg/dL (ref 2.5–4.6)
Potassium: 5.2 mmol/L — ABNORMAL HIGH (ref 3.5–5.1)
Sodium: 140 mmol/L (ref 135–145)

## 2024-06-04 MED ORDER — HYOSCYAMINE SULFATE 0.125 MG PO TABS: 0.1250 mg | ORAL_TABLET | ORAL | 0 refills | Status: AC | PRN

## 2024-06-04 MED ORDER — CEFADROXIL 500 MG PO CAPS
1000.0000 mg | ORAL_CAPSULE | Freq: Two times a day (BID) | ORAL | Status: DC
  Filled 2024-06-04: qty 2

## 2024-06-04 MED ORDER — CEFADROXIL 500 MG PO CAPS: 500.0000 mg | ORAL_CAPSULE | Freq: Every day | ORAL | 0 refills | Status: AC

## 2024-06-04 MED ORDER — SODIUM ZIRCONIUM CYCLOSILICATE 10 G PO PACK
10.0000 g | PACK | Freq: Once | ORAL | Status: AC
  Administered 2024-06-04: 10 g via ORAL
  Filled 2024-06-04: qty 1

## 2024-06-04 MED ORDER — CEFADROXIL 500 MG PO CAPS
500.0000 mg | ORAL_CAPSULE | Freq: Every day | ORAL | Status: DC
  Administered 2024-06-04: 500 mg via ORAL
  Filled 2024-06-04: qty 1

## 2024-06-04 NOTE — Progress Notes (Signed)
 "    Subjective: No acute events overnight.  Spasms are well-controlled and voiding appropriately  Objective: Vital signs in last 24 hours: Temp:  [98.5 F (36.9 C)-98.8 F (37.1 C)] 98.5 F (36.9 C) (01/15 9367) Pulse Rate:  [67-72] 67 (01/15 0632) Resp:  [18] 18 (01/15 9367) BP: (138-147)/(75-89) 147/82 (01/15 9367) SpO2:  [96 %-98 %] 98 % (01/15 9367)  Assessment/Plan: 79 year old male with significant bladder spasm and concern for hematuria after surveillance cystoscopy in clinic.  Followed by Dr. Alvaro  # Bladder spasm # AoCKD  Recent acute worsening of CKD, being worked up by [facility] but has not been able to follow up since 12/23 consult.  Awaiting labs this morning but sample arrived hemolyzed per lab report.  Trying to get recollection.  If creatinine is near his new baseline of 3.1, okay to discharge from urologic perspective.  If worse may benefit from nephrology consult while in hospital.  Successful voiding trial yesterday.  Patient has not had recurrence of spasm or hematuria.    No further interventions required from urology.  Cleared for discharge once medically appropriate  Intake/Output from previous day: 01/14 0701 - 01/15 0700 In: 783 [I.V.:783] Out: 1550 [Urine:1550]  Intake/Output this shift: No intake/output data recorded.  Physical Exam:  General: Alert and oriented CV: No cyanosis Lungs: equal chest rise Gu: Foley catheter in place draining clear yellow urine  Lab Results: Recent Labs    06/02/24 1531 06/02/24 2010 06/03/24 0551  HGB 7.6* 7.5* 7.9*  HCT 22.3* 21.4* 23.4*   BMET Recent Labs    06/02/24 0530 06/02/24 1531 06/02/24 2010 06/03/24 0551  NA 132*  --   --  138  K 4.4  --   --  4.9  CL 99  --   --  107  CO2 22  --   --  22  GLUCOSE 97  --   --  92  BUN 38*  --   --  37*  CREATININE 3.16*  --   --  3.51*  CALCIUM  8.8*  --   --  8.9  HGB 6.8*   < > 7.5* 7.9*  WBC 7.0  --   --  7.6   < > = values in this  interval not displayed.     Studies/Results: CT RENAL STONE STUDY Result Date: 06/03/2024 CLINICAL DATA:  Worsening kidney function following cystoscopy. Bladder spasms and hematuria. EXAM: CT ABDOMEN AND PELVIS WITHOUT CONTRAST TECHNIQUE: Multidetector CT imaging of the abdomen and pelvis was performed following the standard protocol without IV contrast. RADIATION DOSE REDUCTION: This exam was performed according to the departmental dose-optimization program which includes automated exposure control, adjustment of the mA and/or kV according to patient size and/or use of iterative reconstruction technique. COMPARISON:  05/20/2024. FINDINGS: Lower chest: Small bilateral pleural effusions with mild dependent atelectasis bilaterally. Atherosclerotic calcification of the aorta and coronary arteries. Heart is enlarged. No pericardial effusion. Air in the lower esophagus be seen with dysmotility. Slight thickening of the distal esophagus can be seen with gastroesophageal reflux disease. Hepatobiliary: Liver and gallbladder are unremarkable. No biliary ductal dilatation. Pancreas: Negative. Spleen: Negative. Adrenals/Urinary Tract: Adrenal glands are unremarkable. Tiny right renal stone. Kidneys are otherwise unremarkable. Ureters are decompressed. Foley catheter and air in the bladder. There is mild perivesical inflammatory haziness and stranding along the ventral margin. Stomach/Bowel: Tiny hiatal hernia. Stomach, small bowel, appendix and colon are unremarkable. Vascular/Lymphatic: Circumaortic left renal vein. Atherosclerotic calcification of the aorta. No pathologically enlarged  lymph nodes. Reproductive: Brachytherapy seeds in the prostate. Other: No free fluid. Musculoskeletal: Osteopenia. Degenerative changes in the spine. Mild levoconvex scoliosis. IMPRESSION: 1. Mild haziness along the ventral margin of the bladder may be due to cystitis. 2. Tiny right renal stone. 3. Small bilateral pleural effusions. 4.  Aortic atherosclerosis (ICD10-I70.0). Coronary artery calcification. Electronically Signed   By: Newell Eke M.D.   On: 06/03/2024 14:06      LOS: 1 day   Ole Bourdon, NP [facility] Pager: [phone]  06/04/2024, 10:19 AM   "

## 2024-06-04 NOTE — Discharge Summary (Signed)
 Physician Discharge Summary  William Lindsey FMW:[id number] DOB: 17-Aug-1945 DOA: 06/01/2024  PCP: Anita Bernardino BROCKS, FNP  Admit date: 06/01/2024 Discharge date: 06/04/2024  Time spent: 60 minutes  Recommendations for Outpatient Follow-up:  Follow-up with Anita Bernardino BROCKS, FNP in 3 weeks.  On follow-up patient's blood pressure need to be reassessed.  Patient will need a CBC done to follow-up on counts.  Patient will need a basic metabolic profile done to follow-up on electrolytes and renal function. Follow-up with primary nephrologist, Ricka Daring, NP as scheduled on 06/15/2024. Follow-up with Dr. Alvaro, urology in 2 weeks.   Discharge Diagnoses:  Principal Problem:   Gross hematuria Active Problems:   Prostate cancer   Essential hypertension   Primary parkinsonism   Hyperlipidemia   ABLA (acute blood loss anemia)   Hyperkalemia   Acute renal failure superimposed on stage 4 chronic kidney disease (HCC)   Discharge Condition: Stable and improved.  Diet recommendation: Regular  Filed Weights   06/02/24 1415  Weight: 73 kg    History of present illness:  HPI per Dr. Celinda Ubaldo VEAR Dollar is a 79 y.o. male with medical history significant of borderline hypertension, BPH, prostate cancer, diastasis recti, rectal dysfunction, Grovers disease, skin hemangioma, hyperlipidemia, lentigo, mild cognitive impairment, several radiodermatitis, primary parkinsonism, RBBB and left anterior fascicular block, stage IIIa CKD who underwent a cystoscopy yesterday, but went to the [facility] emergency department later in the evening due to pain, gross hematuria and urinary frequency.  No flank pain.  He has felt tired, but denied fever, chills, rhinorrhea, sore throat, wheezing or hemoptysis.  No chest pain, palpitations, diaphoresis, PND, orthopnea or pitting edema of the lower extremities.  No abdominal pain, nausea, emesis, diarrhea, constipation, melena or hematochezia.  No polyuria,  polydipsia, polyphagia or blurred vision.    Lab work: Urinalysis was orange in color with glucose of 250 and protein greater than 300 mg/dL.  Positive large hemoglobin, small bilirubin, trace ketones, positive nitrites and trace leukocyte esterase.  There was rare bacteria microscopic examination and 11-20 WBC.  CBC showed white count 6.5, hemoglobin 6.9 g/dL and platelets 818.  Normal PT and INR.  CMP showed a sodium 129, potassium 4.2, chloride 96 and CO2 20 mmol/L with a normal anion gap.  Glucose 98, BUN 39, creatinine 3.13 mg/dL.  Albumin was 3.4 g/dL and AST 12 units/L, the rest of the LFTs and calcium  after correction were normal.   ED course: Initial vital signs were temperature 98.6 F, pulse 92, respirations 17, BP 160/88 mmHg and O2 sat 97% on room air.  The patient received ceftriaxone  1 g IVPB, fentanyl  50 mcg IVP, morphine  4 mg IVP x 2, oxybutynin  5 mg p.o and Percocet 5/325 mg 1 tablet p.o. x 1.   Hospital Course:  #1 gross hematuria/acute blood loss anemia -Patient noted to have developed gross hematuria, bladder spasms after cystoscopy done in the outpatient setting. - Patient underwent CBI. - Patient seen in consultation by urology. - Patient status posttransfusion 1 unit PRBCs with hemoglobin stabilized at 7.7 from 6.8 by day of discharge. - Gross hematuria had resolved by day of discharge. - CT renal stone protocol obtained per urology today that showed mild haziness along the ventral margin of the bladder may be due to cystitis, tiny right renal stone, small bilateral pleural effusions, aortic atherosclerosis, coronary artery calcification. - Patient noted with intense bladder spasms on 06/03/2024 and hyoscyamine  changed to B and O suppositories and scheduled Ditropan  per urology. -  Patient had resolution of bladder spasms and patient be discharged home in stable and improved condition on hyoscyamine  as needed for bladder spasms. -Outpatient follow-up with urology.   2.  AKI on  CKD stage IV -Patient with worsening renal function during the hospitalization with creatinine going up as high as 3.51 from 3.13 on admission.   - Concern for acute versus progressive CKD as patient being followed in the outpatient setting by [facility] nephrology.   - Status post transfusion 1 unit PRBCs. - Urinalysis done on admission with hematuria, nitrite positive, trace leukocytes, 11-20 WBCs. - Urine cultures negative. - CT renal stone protocol done negative for hydronephrosis. - Per urology Foley catheter found to be obstructed the afternoon of 06/03/2024, and flushed with return of 600 cc, Foley catheter removed and patient passed voiding trial. - Repeat urinalysis done with negative protein, nitrite negative, leukocytes negative, no bacteria seen. - Urine sodium noted at 126, urine creatinine at 87. - Concern for prerenal azotemia versus postrenal azotemia as Foley catheter noted to be obstructed this afternoon. -Patient gently hydrated with IV fluids with renal function trending back down to 3.31 by day of discharge. -Patient seen in consultation by nephrology who feel patient's acute event appears to be hemodynamically mediated with acute blood loss anemia +/- element of obstruction. -Nephrology recommended outpatient follow-up with his primary nephrologist as previously scheduled whom he is already established and current workup underway to determine the etiology of patient's recent rapid progression in regards to his underlying chronic kidney disease. -Patient was discharged home in stable and condition.   3.?  UTI -Patient presented with gross hematuria after recent instrumentation with cystoscopy. - Urine cultures obtained negative. - Patient was on IV Rocephin  during the hospitalization, subsequently transition to Duricef and be discharged home on 2 more days of Duricef to complete a 5-day course of treatment.    4.  History of prostate cancer -Per urology.   5.   Hypertension -Currently off antihypertensive medications. - Outpatient follow-up with PCP.   6.  Primary parkinsonism - Patient maintained on home regimen Sinemet .   7.  Hyperlipidemia - Patient maintained on home regimen statin.  8.  Hyperkalemia -Patient with a mild hyperkalemia of 5.2 likely related to AKI status post single dose of Lokelma  on day of discharge. -Outpatient follow-up.    Procedures: CT renal stone protocol 06/03/2024 Transfusion 1 unit PRBCs 06/02/2024  Consultations: Urology: Dr. Cam 06/02/2024 Nephrology: Dr. Tobie 06/04/2024  Discharge Exam: Vitals:   06/04/24 0632 06/04/24 1332  BP: (!) 147/82 (!) 155/82  Pulse: 67 70  Resp: 18 16  Temp: 98.5 F (36.9 C) 98.2 F (36.8 C)  SpO2: 98% 99%    General: NAD. Cardiovascular: RRR no murmurs rubs or gallops.  No JVD.  No lower extremity edema. Respiratory: CTAB.  No wheezes, no crackles, no rhonchi.  Fair air movement.  Speaking in full sentences.  Discharge Instructions   Discharge Instructions     Diet general   Complete by: As directed    Increase activity slowly   Complete by: As directed       Allergies as of 06/04/2024       Reactions   Lisinopril Other (See Comments)   Or tickle in throat        Medication List     PAUSE taking these medications    losartan 50 MG tablet Wait to take this until your doctor or other care provider tells you to start again. Commonly known as:  COZAAR Take 1 tablet daily for blood pressure control.       TAKE these medications    carbidopa -levodopa  25-100 MG tablet Commonly known as: SINEMET  IR 2 tablet at 6:30/1 at 10:30/2 at 2:30/1 at 6:30 The timing of this medication is very important.   aspirin EC 325 MG tablet Take 325 mg by mouth daily.   atorvastatin  20 MG tablet Commonly known as: LIPITOR Take 20 mg by mouth daily.   cefadroxil  500 MG capsule Commonly known as: DURICEF Take 1 capsule (500 mg total) by mouth daily for 2  days. Start taking on: June 05, 2024   ferrous sulfate  325 (65 FE) MG tablet Take 325 mg by mouth daily before breakfast.   hyoscyamine  0.125 MG tablet Commonly known as: LEVSIN  Take 1 tablet (0.125 mg total) by mouth every 4 (four) hours as needed for bladder spasms.   LYCOPENE PO Take 1 tablet by mouth daily.   nitroGLYCERIN  0.4 MG SL tablet Commonly known as: NITROSTAT  Place 0.4 mg under the tongue every 5 (five) minutes as needed for chest pain.   Oyster Shell Calcium  w/D 500-5 MG-MCG Tabs Take 1 tablet by mouth 2 (two) times daily.   PRESERVISION AREDS PO Take 1 tablet by mouth 2 (two) times daily. Focal       Allergies[1]  Follow-up Information     Anita Bernardino BROCKS, FNP. Schedule an appointment as soon as possible for a visit in 3 week(s).   Specialty: Nurse Practitioner Contact information: [address] [place] [address] [phone]         Alvaro Ricardo KATHEE Mickey., MD. Schedule an appointment as soon as possible for a visit in 2 week(s).   Specialty: Urology Contact information: [address] [place] [address] [phone]         Delores Ricka Maidens, NP Follow up on 06/15/2024.   Specialty: Nurse Practitioner Why: Follow-up as scheduled. Contact information: [address] [place] [address] [phone]                  The results of significant diagnostics from this hospitalization (including imaging, microbiology, ancillary and laboratory) are listed below for reference.    Significant Diagnostic Studies: CT RENAL STONE STUDY Result Date: 06/03/2024 CLINICAL DATA:  Worsening kidney function following cystoscopy. Bladder spasms and hematuria. EXAM: CT ABDOMEN AND PELVIS WITHOUT CONTRAST TECHNIQUE: Multidetector CT imaging of the abdomen and pelvis was performed following the standard protocol without IV contrast. RADIATION DOSE REDUCTION: This exam was performed according to the departmental  dose-optimization program which includes automated exposure control, adjustment of the mA and/or kV according to patient size and/or use of iterative reconstruction technique. COMPARISON:  05/20/2024. FINDINGS: Lower chest: Small bilateral pleural effusions with mild dependent atelectasis bilaterally. Atherosclerotic calcification of the aorta and coronary arteries. Heart is enlarged. No pericardial effusion. Air in the lower esophagus be seen with dysmotility. Slight thickening of the distal esophagus can be seen with gastroesophageal reflux disease. Hepatobiliary: Liver and gallbladder are unremarkable. No biliary ductal dilatation. Pancreas: Negative. Spleen: Negative. Adrenals/Urinary Tract: Adrenal glands are unremarkable. Tiny right renal stone. Kidneys are otherwise unremarkable. Ureters are decompressed. Foley catheter and air in the bladder. There is mild perivesical inflammatory haziness and stranding along the ventral margin. Stomach/Bowel: Tiny hiatal hernia. Stomach, small bowel, appendix and colon are unremarkable. Vascular/Lymphatic: Circumaortic left renal vein. Atherosclerotic calcification of the aorta. No pathologically enlarged lymph nodes. Reproductive: Brachytherapy seeds in the prostate. Other: No  free fluid. Musculoskeletal: Osteopenia. Degenerative changes in the spine. Mild levoconvex scoliosis. IMPRESSION: 1. Mild haziness along the ventral margin of the bladder may be due to cystitis. 2. Tiny right renal stone. 3. Small bilateral pleural effusions. 4. Aortic atherosclerosis (ICD10-I70.0). Coronary artery calcification. Electronically Signed   By: Newell Eke M.D.   On: 06/03/2024 14:06    Microbiology: Recent Results (from the past 240 hours)  Urine Culture     Status: None   Collection Time: 06/01/24 10:24 PM   Specimen: Urine, Clean Catch  Result Value Ref Range Status   Specimen Description   Final    URINE, CLEAN CATCH Performed at [facility], [address] [place], [place] [address]    Special Requests   Final    NONE Performed at [facility], [address], [place], [place] [address]    Culture   Final    NO GROWTH Performed at [facility], [address], [place], [place] [address]    Report Status 06/03/2024 FINAL  Final     Labs: Basic Metabolic Panel: Recent Labs  Lab 06/02/24 0045 06/02/24 0530 06/03/24 0551 06/04/24 1016  NA 129* 132* 138 140  K 4.2 4.4 4.9 5.2*  CL 96* 99 107 109  CO2 20* 22 22 24   GLUCOSE 98 97 92 118*  BUN 39* 38* 37* 35*  CREATININE 3.13* 3.16* 3.51* 3.31*  CALCIUM  8.4* 8.8* 8.9 8.7*  PHOS  --   --   --  4.6   Liver Function Tests: Recent Labs  Lab 06/02/24 0045 06/03/24 0551 06/04/24 1016  AST 12* 12*  --   ALT <5 <5  --   ALKPHOS 52 55  --   BILITOT 0.6 0.3  --   PROT 8.1 8.3*  --   ALBUMIN 3.4* 3.2* 3.1*   No results for input(s): LIPASE, AMYLASE in the last 168 hours. No results for input(s): AMMONIA in the last 168 hours. CBC: Recent Labs  Lab 06/02/24 0045 06/02/24 0530 06/02/24 1531 06/02/24 2010 06/03/24 0551 06/04/24 1016  WBC 6.5 7.0  --   --  7.6 6.9  NEUTROABS 4.8 4.7  --   --   --   --   HGB 6.9* 6.8* 7.6* 7.5* 7.9* 7.7*  HCT 19.9* 19.6* 22.3* 21.4* 23.4* 23.1*  MCV 95.7 96.6  --   --  97.5 99.1  PLT 181 185  --   --  195 189   Cardiac Enzymes: No results for input(s): CKTOTAL, CKMB, CKMBINDEX, TROPONINI in the last 168 hours. BNP: BNP (last 3 results) No results for input(s): BNP in the last 8760 hours.  ProBNP (last 3 results) No results for input(s): PROBNP in the last 8760 hours.  CBG: No results for input(s): GLUCAP in the last 168 hours.     Signed:  Toribio Hummer MD.  [facility] 06/04/2024, 3:30 PM       [1]  Allergies Allergen Reactions   Lisinopril Other (See Comments)    Or tickle in throat

## 2024-06-04 NOTE — Consult Note (Signed)
 Reason for Consult: Acute kidney injury on chronic kidney disease stage IV Referring Physician: Toribio Hummer, MD  HPI:  79 year old man with past medical history significant for parkinsonism, hypertension, history of prostate cancer, dyslipidemia, BPH and recent (rapid) progression of chronic kidney disease.  His creatinine was previously was 1.3-1.5 in 2021, had climbed to 2.5 in 2024 and more recently risen to 3.1 in 2025.  He was seen in consultation by [facility] nephrology with plans noted for ongoing workup and management.  He was hospitalized on 06/02/2024 with concerns of gross hematuria/urinary frequency after having undergone cystoscopy 1 day prior.  Admission labs were significant for acute blood loss anemia.  He was seen by urology and started on continuous bladder irrigation along with placement of a Foley catheter to alleviate bladder outlet obstruction with concern raised because of rising creatinine that went up to 3.5 and today is down to 3.3.  He was nonoliguric overnight with 1.55 L urine output and records review do not show any iodinated intravenous contrast exposure or significant episode of hypotension.  Past Medical History:  Diagnosis Date   Borderline hypertension    no meds   BPH (benign prostatic hyperplasia)    Diastasis recti 05/19/2019   Erectile dysfunction 10/04/2015   Essential hypertension 04/05/2016   Grover's disease    Hemangioma of skin and subcutaneous tissue    Hyperlipidemia    Lentigo    Mild cognitive impairment of uncertain or unknown etiology 08/27/2023   Other seborrheic keratosis    Primary parkinsonism 08/07/2023   Prostate cancer 2013   moderate risk T1c, Gleason 3+3, PSA 5.75, vol 57ml -- planned Radioactive seed implants 01-26-2016   Pure hypercholesterolemia 10/04/2015   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    Stage 3a chronic kidney disease 03/06/2020   Tremors of nervous system 03/01/2021   Wears  glasses     Past Surgical History:  Procedure Laterality Date   APPENDECTOMY  teen   COLONOSCOPY  last one 07/ 2017   INGUINAL HERNIA REPAIR Left 1993   KNEE ARTHROSCOPY Left last one -- left 01/ 2017   PROSTATE BIOPSY     multiple   RADIOACTIVE SEED IMPLANT N/A 01/26/2016   Procedure: RADIOACTIVE SEED IMPLANT/BRACHYTHERAPY IMPLANT;  Surgeon: Ricardo Likens, MD;  Location[facility];  Service: Urology;  Laterality: N/A;   SHOULDER ARTHROSCOPY WITH OPEN ROTATOR CUFF REPAIR Left early 2000's   TONSILLECTOMY  child    Family History  Problem Relation Age of Onset   Alzheimer's disease Mother        late-onset   Lung cancer Father    Breast cancer Sister    Healthy Child    Parkinson's disease Neg Hx    Tremor Neg Hx     Social History:  reports that he has never smoked. He has never used smokeless tobacco. He reports that he does not drink alcohol and does not use drugs.  Allergies: Allergies[1]  Medications: Scheduled:  atorvastatin   20 mg Oral Daily   calcium -vitamin D  1 tablet Oral BID   carbidopa -levodopa   2 tablet Oral 2 times per day   And   carbidopa -levodopa   1 tablet Oral 2 times per day   cefadroxil   500 mg Oral Daily   ferrous sulfate   325 mg Oral QAC breakfast   Continuous:  sodium chloride  125 mL/hr at 06/03/24 2239       Latest Ref Rng & Units 06/04/2024   10:16  AM 06/03/2024    5:51 AM 06/02/2024    5:30 AM  BMP  Glucose 70 - 99 mg/dL 881  92  97   BUN 8 - 23 mg/dL 35  37  38   Creatinine 0.61 - 1.24 mg/dL 6.68  6.48  6.83   Sodium 135 - 145 mmol/L 140  138  132   Potassium 3.5 - 5.1 mmol/L 5.2  4.9  4.4   Chloride 98 - 111 mmol/L 109  107  99   CO2 22 - 32 mmol/L 24  22  22    Calcium  8.9 - 10.3 mg/dL 8.7  8.9  8.8       Latest Ref Rng & Units 06/04/2024   10:16 AM 06/03/2024    5:51 AM 06/02/2024    8:10 PM  CBC  WBC 4.0 - 10.5 K/uL 6.9  7.6    Hemoglobin 13.0 - 17.0 g/dL 7.7  7.9  7.5   Hematocrit 39.0 - 52.0 % 23.1  23.4   21.4   Platelets 150 - 400 K/uL 189  195     Urinalysis    Component Value Date/Time   COLORURINE YELLOW 06/03/2024 1841   APPEARANCEUR CLEAR 06/03/2024 1841   LABSPEC 1.011 06/03/2024 1841   PHURINE 5.0 06/03/2024 1841   GLUCOSEU NEGATIVE 06/03/2024 1841   HGBUR LARGE (A) 06/03/2024 1841   BILIRUBINUR NEGATIVE 06/03/2024 1841   KETONESUR NEGATIVE 06/03/2024 1841   PROTEINUR NEGATIVE 06/03/2024 1841   NITRITE NEGATIVE 06/03/2024 1841   LEUKOCYTESUR NEGATIVE 06/03/2024 1841   CT RENAL STONE STUDY Result Date: 06/03/2024 CLINICAL DATA:  Worsening kidney function following cystoscopy. Bladder spasms and hematuria. EXAM: CT ABDOMEN AND PELVIS WITHOUT CONTRAST TECHNIQUE: Multidetector CT imaging of the abdomen and pelvis was performed following the standard protocol without IV contrast. RADIATION DOSE REDUCTION: This exam was performed according to the departmental dose-optimization program which includes automated exposure control, adjustment of the mA and/or kV according to patient size and/or use of iterative reconstruction technique. COMPARISON:  05/20/2024. FINDINGS: Lower chest: Small bilateral pleural effusions with mild dependent atelectasis bilaterally. Atherosclerotic calcification of the aorta and coronary arteries. Heart is enlarged. No pericardial effusion. Air in the lower esophagus be seen with dysmotility. Slight thickening of the distal esophagus can be seen with gastroesophageal reflux disease. Hepatobiliary: Liver and gallbladder are unremarkable. No biliary ductal dilatation. Pancreas: Negative. Spleen: Negative. Adrenals/Urinary Tract: Adrenal glands are unremarkable. Tiny right renal stone. Kidneys are otherwise unremarkable. Ureters are decompressed. Foley catheter and air in the bladder. There is mild perivesical inflammatory haziness and stranding along the ventral margin. Stomach/Bowel: Tiny hiatal hernia. Stomach, small bowel, appendix and colon are unremarkable.  Vascular/Lymphatic: Circumaortic left renal vein. Atherosclerotic calcification of the aorta. No pathologically enlarged lymph nodes. Reproductive: Brachytherapy seeds in the prostate. Other: No free fluid. Musculoskeletal: Osteopenia. Degenerative changes in the spine. Mild levoconvex scoliosis. IMPRESSION: 1. Mild haziness along the ventral margin of the bladder may be due to cystitis. 2. Tiny right renal stone. 3. Small bilateral pleural effusions. 4. Aortic atherosclerosis (ICD10-I70.0). Coronary artery calcification. Electronically Signed   By: Newell Eke M.D.   On: 06/03/2024 14:06    Review of Systems  Constitutional:  Positive for fatigue. Negative for chills and fever.  HENT:  Negative for facial swelling, hearing loss and nosebleeds.   Eyes:  Negative for photophobia and visual disturbance.  Respiratory:  Negative for cough, chest tightness and shortness of breath.   Cardiovascular:  Negative for chest pain and leg swelling.  Gastrointestinal:  Negative for abdominal pain, blood in stool, diarrhea, nausea and vomiting.  Musculoskeletal:  Negative for gait problem and myalgias.  Neurological:  Positive for weakness. Negative for light-headedness.   Blood pressure (!) 147/82, pulse 67, temperature 98.5 F (36.9 C), temperature source Oral, resp. rate 18, height 5' 8 (1.727 m), weight 73 kg, SpO2 98%. Physical Exam Vitals and nursing note reviewed.  Constitutional:      General: He is not in acute distress.    Appearance: Normal appearance. He is normal weight.  HENT:     Head: Normocephalic and atraumatic.     Right Ear: Tympanic membrane normal.     Left Ear: Tympanic membrane normal.     Nose: Nose normal.     Mouth/Throat:     Mouth: Mucous membranes are dry.  Eyes:     General: No scleral icterus.    Extraocular Movements: Extraocular movements intact.     Conjunctiva/sclera: Conjunctivae normal.  Cardiovascular:     Rate and Rhythm: Normal rate and regular rhythm.      Pulses: Normal pulses.     Heart sounds: Normal heart sounds.  Pulmonary:     Effort: Pulmonary effort is normal.     Breath sounds: Normal breath sounds. No rales.  Abdominal:     General: Bowel sounds are normal.     Palpations: Abdomen is soft.  Musculoskeletal:     Cervical back: Normal range of motion and neck supple.     Right lower leg: No edema.     Left lower leg: No edema.  Skin:    General: Skin is warm and dry.     Coloration: Skin is pale.  Neurological:     General: No focal deficit present.     Mental Status: He is alert.     Assessment/Plan: 1.  Acute kidney injury on chronic kidney disease stage IV: Workup underway to determine etiology of recent rapid progression with regards to his underlying chronic kidney disease.  His acute event appears to have likely been hemodynamically mediated with acute blood loss anemia +/- element of obstruction.  Creatinine trending down towards his baseline and I do not see any barriers to possibly discharging home today to follow-up with outpatient nephrology with whom he is already established. 2.  Hyperkalemia: Mild and likely related to acute kidney injury/impaired potassium handling.  Treated with single dose of Lokelma  earlier. 3.  Hematuria/dysuria/bladder spasms status post cystoscopy: Treated with antibiotics for possible cystitis status post CBI. 4.  Acute blood loss anemia: Secondary to hematuria, status post PRBC transfusion and will continue to be monitored as an outpatient with additional management per nephrology/hematology.  Gordy MARLA Blanch 06/04/2024, 1:04 PM         [1]  Allergies Allergen Reactions   Lisinopril Other (See Comments)    Or tickle in throat

## 2024-06-05 LAB — URINE CULTURE: Culture: NO GROWTH

## 2024-06-16 NOTE — ED Provider Notes (Signed)
 ------------------------------------------------------------------------------- Attestation signed by Paulina Earnie Easterly, MD at 06/20/2024  4:05 PM ATTENDING SUPERVISORY SHARED SERVICE NOTE  I supervised the care provided by the APP. We have discussed the patient's case. I have reviewed the note and agree with the plan of treatment.  I have personally seen and examined the patient, providing direct face to face care.    I performed a substantive portion of the  history, physical exam, MDM for the patient encounter, as documented by the APP.    I have personally viewed the imaging studies performed.   Sent by nephrology for worsening renal insufficiency.  Admit per their recommendations.  Patient otherwise well. Patient's presentation is most consistent with acute presentation with potential threat to life or bodily function  I was present for the following procedures: None  Time Spent in Critical Care of the patient: None     William Earnie Easterly, MD  -------------------------------------------------------------------------------  Patient placed in First Look pathway, seen and evaluated for chief complaint of worsening chronic kidney disease. Reports he was told to come to the ED by outpatient provider. Patient was recently admitted to an outside facility.  Pertinent exam findings include non-toxic in appearance, speech is clear, and ambulates with a steady gait. Based on initial evaluation, labs are currently indicated and radiology studies are not currently indicated as allowed for current processes and treatments as applicable in a triage setting and could be different than if patient were seen in a main treatment area or dependent on labs/imagining after results are displayed.  Patient counseled on process, plan, and necessity for staying for completing the evaluation.   This document serves as a record of services personally performed by William Grate PA-C.    [facility] Emergency Department Emergency Department Provider Note  This document was created using the aid of voice recognition Dragon dictation software.   Provider at bedside: 06/17/2024 2:29 PM  History obtained from the: Patient  History   Chief Complaint  Patient presents with   Abnormal Lab    HPI  William Lindsey is a 79 y.o. male who presents to the ED with complaints of worsening renal disease.  Reportedly per the patient's nephrologist, he has had worsening kidney disease and also has concerns for multiple myeloma, recommended patient admit to the hospital  ______________________ ROS: Pertinent positives and negatives per HPI.  Physical Exam   Vitals:   06/16/24 1621 06/16/24 1921 06/17/24 0344 06/17/24 0737  BP: 157/89 (!) 162/83 141/79 143/83  BP Location: Left arm Left arm Left arm Left arm  Patient Position: Sitting Sitting Lying Lying  Pulse: 71 78 70 73  Resp: 16 18 15 18   Temp: 97.9 F (36.6 C) 98.6 F (37 C) 98 F (36.7 C) 97.8 F (36.6 C)  TempSrc: Oral Oral Oral Oral  SpO2: 100% 100% 97% 99%  Weight:      Height:        Physical Exam Constitutional:      General: He is not in acute distress.    Appearance: Normal appearance. He is not ill-appearing.  Cardiovascular:     Rate and Rhythm: Normal rate and regular rhythm.  Pulmonary:     Effort: Pulmonary effort is normal. No respiratory distress.     Breath sounds: Normal breath sounds. No stridor. No wheezing, rhonchi or rales.  Abdominal:     General: There is no distension.     Tenderness: There is no abdominal tenderness. There is no guarding or  rebound.  Skin:    General: Skin is warm and dry.  Neurological:     General: No focal deficit present.     Mental Status: He is alert and oriented to person, place, and time.      ED Course     Medical Decision Making  Initial Intervention:   DDX: CKD, multiple myeloma, leukemia, AKI, metabolic derangement electrolyte  abnormality  Clinical Complexity     Patient's presentation is most consistent with acute presentation with potential threat to life or bodily function.  Provider time spent in patient care today, inclusive of but not limited to clinical reassessment, review of diagnostic studies, and discharge preparation, was greater than 30 minutes.   All Imaging and lab work personally viewed and interpreted by myself. My interpretations are as follow:  Patient presenting with worsening renal disease.  Sent here by his nephrologist.  After discussing the patient with his nephrologist he is concerned he also might have multiple myeloma.  He recommends that I admit the patient for further renal workup and hematology oncology consult.  Will consult them.  Plan admit to hospitalist hospitalist agreed to admit  Medical Decision Making Amount and/or Complexity of Data Reviewed Labs: ordered.  Risk Decision regarding hospitalization.    ED Clinical Impression  No diagnosis found. FOLLOW UP No follow-up provider specified.  ED Disposition     ED Disposition  Admit   Condition  --   Comment  Provider Group:: [facility] Hospitalist Team [facility]) [[id number]]  Certification: I certify that inpatient services are medically necessary for this patient for a duration of greater than two midnights OR that the patient is undergoing a procedure deemed inpatient only by CMS.  Primary Provider Group: Yes [1]         _____________________________

## 2024-06-16 NOTE — Consults (Signed)
 "                                            NEPHROLOGY CONSULT NOTE    William Lindsey is being seen in consultation at the request of Ulyses Greener, MD for evaluation of AKI.    HPI   William Lindsey is a 79 year old man with HTN, HLD, parkinsonism, BPH, history of prostate cancer, stage III CKD and recent progressive decline in his renal function who was asked to come to the ED today for evaluation and management of his progressive AKI with abnormal serum free light chains. He had been seen on initial consultation by Ricka Daring, NP at [facility] nephrology clinic on 05/12/2024 for progressive AKI on CKD.  His previous baseline serum creatinine was 1.3-1.5 in 2021 and climbed to 2.5 in 2024. It had increased to 3.1 in December 2025 and was 3.56 on 05/29/2024.  Workup at the outpatient clinic,  including serum free light chains with ratio revealed elevated free kappa and free lambda light chains with ratio of 37.25. SPEP revealed M spike in the gamma region.  He was recently admitted to [facility] from 06/01/2024 -06/04/2024 for gross hematuria with acute blood loss anemia and bladder spasms after cystoscopy done in the outpatient setting. He was transfused 1 unit of PRBC and underwent continuous bladder irrigation.SABRA  He complains of occasional chest tightness on minimal exertion such as pushing his lawnmower at home.   He does not have any nausea, vomiting, diarrhea, fever or chills.  On evaluation in the ED today, his initial BP was 167/85 with heart rate of 81, respiratory rate 17 and temp 98.3 F.  O2 sats 96% on room air.  Pertinent labs revealed Na+ 136, K+ 4.3, BUN/Creat 51/ 3.53 mg/dl, WBC 3.51, Hgb 9.4, platelets 297.    Medical History: Medical History[1]  Surgical History: Surgical History[2]  Allergies: Lisinopril   Medications:   Current Medications[3]     Social History:  Social History[4]   Family History: Family History[5]   Review of  Systems: As in history above.  All other systems are negative.  Objective  Vital Signs last24 hours: Temp:  [97.9 F (36.6 C)-98.3 F (36.8 C)] 97.9 F (36.6 C) Heart Rate:  [70-81] 71 Resp:  [15-17] 16 BP: (142-157)/(83-89) 157/89  Physical Exam: General appearance - alert well-appearing elderly man in bed.  Not in acute distress. Mouth - mucous membranes moist. Chest - clear to auscultation, no wheezes, rales or rhonchi,symmetric air entry Heart -s1s2 present. Abdomen - flat,soft, nontender, bs present. Neurological - alert, oriented, normal speech, no focal findings  Musculoskeletal - no jointtenderness, deformity or swelling Extremities -no cyanosis.  No lower extremity edema. Skin - normal coloration, no rashes, no suspicious skin lesions noted  Labs:   Lab Results  Component Value Date   WBC 6.48 06/16/2024   HGB 9.4 (L) 06/16/2024   HCT 27.7 (L) 06/16/2024   PLT 297 06/16/2024    Lab Results  Component Value Date   NA 136 06/16/2024   K 4.3 06/16/2024   CL 103 06/16/2024   CO2 24 06/16/2024   BUN 51 (H) 06/16/2024   CREATININE 3.53 (H) 06/16/2024   CALCIUM  8.7 06/16/2024   PHOS 4.5 05/29/2024    Lab Results  Component Value Date   BILITOT 0.4 06/16/2024   PROT 10.1 (  H) 06/16/2024   ALBUMIN 3.9 06/16/2024   ALT <3 (L) 06/16/2024   AST 9 (L) 06/16/2024    No results found for: LABPROT, INR, APTT    Imaging:  CT abdomen/pelvis at [facility] on 06/03/2024. IMPRESSION:  1. Mild haziness along the ventral margin of the bladder may be due  to cystitis.  2. Tiny right renal stone.  3. Small bilateral pleural effusions.  4. Aortic atherosclerosis (ICD10-I70.0). Coronary artery  calcification.   Renal sonogram from 04/30/2024. IMPRESSION: 1.  Nonobstructive right lower pole nephrolithiasis. No hydronephrosis bilaterally. 2.  Increased bilateral parenchymal echogenicity, can be seen in setting of medical renal disease.   Assessment:  1)  AKI on CKD.     ? From monoclonal gammopathy. R/O element of prerenal azotemia.  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  3) Anemia of chronic disease.  4) HTN.  4) Parkinsonism.   Plan:  1) Send urine for UA, electrolytes. 2) Start gentle IVF hydration with N/Saline at 75 mls/hr. 3) Hematology/ Oncology consultation. 4) Bone marrow biopsy. 5) Monitor BMP, CBC, I/O. 6) Continue other supportive care.   Thank you for the consultation.  We will follow the patient with you.   Eulas Manes MD, FACP 06/16/2024 5:42 PM           [1] Past Medical History: Diagnosis Date   HL (hearing loss)    Hypercholesterolemia    Hypertension    Impingement syndrome, shoulder, right 05/19/2019   Malignant neoplasm of prostate    (CMD) 10/21/2015   Tubular adenoma of colon   [2] Past Surgical History: Procedure Laterality Date   ADENOIDECTOMY     APPENDECTOMY  11/18/1959   APPENDECTOMY aproximate date   EYE SURGERY     HERNIA REPAIR     KNEE ARTHROSCOPY Left 11/18/2007   approximate date   PROSTATE SURGERY     TONSILLECTOMY     Procedure: TONSILLECTOMY   TONSILLECTOMY    [3]  Current Facility-Administered Medications:    acetaminophen  (TYLENOL ) tablet 650 mg, 650 mg, oral, Q6H PRN, Eshwar Lal, MD   amLODIPine (NORVASC) tablet 2.5 mg, 2.5 mg, oral, Daily, Ulyses Greener, MD, 2.5 mg at 06/16/24 1518   dextrose (D50W) 50 % injection 12.5 g, 12.5 g, intravenous, PRN, Eshwar Lal, MD   dextrose (GLUTOSE) 40 % oral gel 15 g, 15 g, oral, PRN, Ulyses Greener, MD   heparin (porcine) 5,000 unit/mL injection 5,000 Units, 5,000 Units, subcutaneous, Q8H SCH, Ulyses Greener, MD, 5,000 Units at 06/16/24 1519   insulin lispro (HumaLOG) injection 0-6 Units, 0-6 Units, subcutaneous, With meals & at bedtime, Ulyses Greener, MD   ondansetron  (ZOFRAN ) injection 4 mg, 4 mg, intravenous, Q6H PRN, Ulyses Greener, MD   sodium chloride  0.9 % infusion, 75 mL/hr, intravenous, Continuous,  Ulyses Greener, MD, Last Rate: 75 mL/hr at 06/16/24 1634, 75 mL/hr at 06/16/24 1634 [4] Social History Socioeconomic History   Marital status: Married  Tobacco Use   Smoking status: Never    Passive exposure: Never   Smokeless tobacco: Never  Vaping Use   Vaping status: Never Used  Substance and Sexual Activity   Alcohol use: Never   Drug use: Never   Sexual activity: Not Currently    Partners: Female  Social History Narrative   Wife: [name].    Poodle.   Retired from [employer], [place], [place]..  Mother, Jettson Crable, has dementia.   Social Drivers of Health   Living Situation: Low Risk (06/16/2024)  Living Situation    What is your living situation today?: I have a steady place to live    Think about the place you live. Do you have problems with any of the following? Choose all that apply:: None/None on this list  Food Insecurity: Low Risk (06/16/2024)   Food vital sign    Within the past 12 months, you worried that your food would run out before you got money to buy more: Never true    Within the past 12 months, the food you bought just didn't last and you didn't have money to get more: Never true  Transportation Needs: No Transportation Needs (06/16/2024)   Transportation    In the past 12 months, has lack of reliable transportation kept you from medical appointments, meetings, work or from getting things needed for daily living? : No  Utilities: Low Risk (06/16/2024)   Utilities    In the past 12 months has the electric, gas, oil, or water company threatened to shut off services in your home? : No  Safety: Low Risk (06/16/2024)   Safety    How often does anyone, including family and friends, physically hurt you?: Never    How often does anyone, including family and friends, insult or talk down to you?: Never    How often does anyone, including family and friends, threaten you with harm?: Never    How often does anyone, including family and  friends, scream or curse at you?: Never  Alcohol Screening: Not At Risk (03/13/2024)   Alcohol    Audit C Alcohol risk score: 0  Tobacco Use: Low Risk (06/16/2024)   Patient History    Smoking Tobacco Use: Never    Smokeless Tobacco Use: Never    Passive Exposure: Never  Depression: Not At Risk (05/12/2024)   PHQ-2    PHQ-2 Score: 0  Social Connections: Socially Integrated (06/16/2024)   Social Connection and Isolation Panel    Frequency of Communication with Friends and Family: Twice a week    Frequency of Social Gatherings with Friends and Family: Twice a week    Attends Religious Services: More than 4 times per year    Active Member of [employer] or Organizations: Yes    Attends Banker Meetings: More than 4 times per year    Marital Status: Married  Physicist, Medical Strain: Low Risk (06/16/2024)   Overall Financial Resource Strain (CARDIA)    Difficulty of Paying Living Expenses: Not very hard  [5] Family History Problem Relation Name Age of Onset   Cancer Father Judd Mccubbin   "

## 2024-06-16 NOTE — Telephone Encounter (Signed)
 Discussing with Ricka Daring, NP

## 2024-06-18 NOTE — Progress Notes (Signed)
 Case Management Discharge Note        CSN: [id number] DOB: [birth year] Service: General Medicine Location: 769/01  Patient Class: Inpatient  DC Disposition: : Home or Self Care  Discharge DC Disposition: : Home or Self Care  Discharge Referrals Case closed, patient/family agree with disposition plan: Yes       Case Management Coordination Status: Coordination Complete     William Lindsey Ever Douglas, MSW

## 2024-06-18 NOTE — Telephone Encounter (Signed)
 New patient schedulers- Dr. Leeann has been in contact with Dr. Volanda in [facility] for pt with new dx myeloma. Referral has been placed. Per Dr. Corky request, please schedule patient with Dr. Virl- Mizrachi on 06/24/24.    Thanks

## 2024-06-18 NOTE — Consults (Addendum)
 "  New Patient Hematology/Oncology Evaluation  Patient Name:  William Lindsey Date of Birth:  Aug 12, 1945 Date of Encounter:  06/16/2024  Referring Provider:  No ref. provider found,   PCP:  Shawn P Lazoff, DO  Diagnoses and reason for visit:  Suspected Myeloma       History of Present Illness:  [name] is a 79 y.o. male who is seen in consultation at the request of No ref. 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   He was taken from chart review and talking with the patient. He has history of hypertension, chronic kidney disease, parkinsonism.  He also has prediabetes.  He recently had gross hematuria requiring admission at [facility] long and required cystoscopy/blood transfusion at that visit.  His kidney function has been progressively getting worse and was following with nephrology as outpatient.  He had myeloma labs done which showed presence of M protein as well as elevated serum light chains.  He was admitted to hospital for workup due to rapidly declining kidney function.  His kidney function which shows creatinine has been slowly downtrending for last 3 months.  His total protein is high.  Immunofixation showed monoclonal IgG kappa.  Protein electrophoresis showed 2.87 g/L of M protein.  Free light chain showed kappa light chain of 986 and lambda have 26 with ratio of 37.  Had bone marrow aspiration biopsy done on 06/17/2024, results are pending.  I discussed with myeloma physician at [facility].  Per myeloma attending at main campus, since he is asymptomatic, he can be discharged home with dexamethasone  for 4 days with close outpatient follow-up to follow on biopsy result and plan for treatment of symptomatic myeloma.    Problem List[1]  Medical History[2]  Surgical History[3]  Allergies[4]  Current Medications[5]  Social History[6]  Family History[7]  Review of Systems: All other systems are negative.   Physical  Examination:  BP 138/83 (BP Location: Right arm, Patient Position: Lying)   Pulse 73   Temp 97.9 F (36.6 C) (Oral)   Resp 17   Ht 1.727 m (5' 7.99)   Wt 76.4 kg (168 lb 6.4 oz)   SpO2 97%   BMI 25.61 kg/m  General:  ECOG 1 HEENT: Normocephalic, atraumatic. PERRLA, sclera anicteric.  Nose without discharge.  No thrush or mucositis noted on oropharyngeal examination. Oral mucosa moist.  Pharynx had no tonsillar enlargement or exudates.  Neck supple without masses. No thyromegaly present. Trachea midline. Lymphatic/Immunologic:  No cervical, axillary, or femoral adenopathy.  Cardiovascular:  Regular rate and rhythm. Respiratory:  Chest clear to auscultation bilaterally; no respiratory distress.  Gastrointestinal:  Abdomen soft and without tenderness; no hepatosplenomegaly or other masses. No clinical ascites. No rebound or guarding tenderness. Musculoskeletal:  No bony pain or tenderness. No tenosynovitis or joint effusions noted. Extremities:  No edema or suspicious rashes.  No cyanosis or clubbing. Skin:  No pathologic appearing petechiae or bruising noted.  Neurologic: Alert and oriented to person, place and circumstance.  Strength and sensation are grossly intact.  Cranial nerves III through XII grossly intact. Psychiatric:  Mood and affect are normal. Speech is fluent.   Clinical data reviewed at this office visit:   Results for orders placed or performed during the hospital encounter of 06/16/24  Comprehensive Metabolic Panel   Collection Time: 06/16/24  2:21 PM  Result Value Ref Range   Sodium 136 136 - 145 mmol/L   Potassium 4.3 3.4 - 4.5 mmol/L  Chloride 103 98 - 107 mmol/L   CO2 24 21 - 31 mmol/L   Anion Gap 9 6 - 14 mmol/L   Glucose, Random 119 (H) 70 - 99 mg/dL   Blood Urea Nitrogen (BUN) 51 (H) 7 - 25 mg/dL   Creatinine 6.46 (H) 9.29 - 1.30 mg/dL   eGFR 17 (L) >40 fO/fpw/8.26f7   Albumin 3.9 3.5 - 5.7 g/dL   Total Protein 89.8 (H) 6.4 - 8.9 g/dL   Bilirubin,  Total 0.4 0.3 - 1.0 mg/dL   Alkaline Phosphatase (ALP) 49 34 - 104 U/L   Aspartate Aminotransferase (AST) 9 (L) 13 - 39 U/L   Alanine Aminotransferase (ALT) <3 (L) 7 - 52 U/L   Calcium  8.7 8.6 - 10.3 mg/dL   BUN/Creatinine Ratio 14.4 10.0 - 20.0  CBC with Differential   Collection Time: 06/16/24  2:21 PM  Result Value Ref Range   WBC 6.48 4.40 - 11.00 10*3/uL   RBC 2.89 (L) 4.50 - 5.90 10*6/uL   Hemoglobin 9.4 (L) 14.0 - 17.5 g/dL   Hematocrit 72.2 (L) 58.4 - 50.4 %   Mean Corpuscular Volume (MCV) 95.8 80.0 - 96.0 fL   Mean Corpuscular Hemoglobin (MCH) 32.5 27.5 - 33.2 pg   Mean Corpuscular Hemoglobin Conc (MCHC) 33.9 33.0 - 37.0 g/dL   Red Cell Distribution Width (RDW) 13.5 12.3 - 17.0 %   Platelet Count (PLT) 297 150 - 450 10*3/uL   Mean Platelet Volume (MPV) 7.5 6.8 - 10.2 fL   Neutrophils % 67 %   Lymphocytes % 23 %   Monocytes % 7 %   Eosinophils % 3 %   Basophils % 1 %   Neutrophils Absolute 4.30 1.80 - 7.80 10*3/uL   Lymphocytes # 1.50 1.00 - 4.80 10*3/uL   Monocytes # 0.40 0.00 - 0.80 10*3/uL   Eosinophils # 0.20 0.00 - 0.50 10*3/uL   Basophils # 0.00 0.00 - 0.20 10*3/uL  Hemoglobin A1C With Estimated Average Glucose   Collection Time: 06/16/24  2:21 PM  Result Value Ref Range   Hemoglobin A1c 5.9 (H) <5.7 %   Estimated Average Glucose 123 mg/dL  Urinalysis with Reflex to Microscopic   Collection Time: 06/16/24  2:26 PM  Result Value Ref Range   Color, Urine Yellow Yellow   Clarity, Urine Clear Clear   Specific Gravity, Urine 1.015 1.005 - 1.025   pH, Urine 5.5 5.0 - 8.0   Protein, Urine 20 Negative, 10 , 20  mg/dL   Glucose, Urine Negative Negative, 30 , 50  mg/dL   Ketones, Urine Negative Negative, Trace mg/dL   Bilirubin, Urine Negative Negative   Blood, Urine 2+ (A) Negative, Trace   Nitrite, Urine Negative Negative   Leukocyte Esterase, Urine Negative Negative, 25    Urobilinogen, Urine Normal <2.0 mg/dL   WBC, Urine 0-5 <6 /HPF   RBC, Urine >20 (A) 0 -  2 /HPF   Bacteria, Urine Rare None Seen, Rare /HPF  POC Glucose   Collection Time: 06/16/24  4:25 PM  Result Value Ref Range   Glucose, POC 84 70 - 99 mg/dL  POC Glucose   Collection Time: 06/16/24  8:24 PM  Result Value Ref Range   Glucose, POC 177 (H) 70 - 99 mg/dL  Basic Metabolic Panel   Collection Time: 06/17/24 12:53 AM  Result Value Ref Range   Sodium 137 136 - 145 mmol/L   Potassium 4.5 3.4 - 4.5 mmol/L   Chloride 108 (H) 98 - 107 mmol/L  CO2 22 21 - 31 mmol/L   Anion Gap 7 6 - 14 mmol/L   Glucose, Random 75 70 - 99 mg/dL   Blood Urea Nitrogen (BUN) 51 (H) 7 - 25 mg/dL   Creatinine 6.33 (H) 9.29 - 1.30 mg/dL   eGFR 16 (L) >40 fO/fpw/8.26f7   Calcium  8.4 (L) 8.6 - 10.3 mg/dL   BUN/Creatinine Ratio 13.9 10.0 - 20.0  Magnesium   Collection Time: 06/17/24 12:53 AM  Result Value Ref Range   Magnesium 2.1 1.9 - 2.7 mg/dL  CBC without Differential   Collection Time: 06/17/24 12:53 AM  Result Value Ref Range   WBC 5.53 4.40 - 11.00 10*3/uL   RBC 2.42 (L) 4.50 - 5.90 10*6/uL   Hemoglobin 8.2 (L) 14.0 - 17.5 g/dL   Hematocrit 76.8 (L) 58.4 - 50.4 %   Mean Corpuscular Volume (MCV) 95.2 80.0 - 96.0 fL   Mean Corpuscular Hemoglobin (MCH) 34.0 (H) 27.5 - 33.2 pg   Mean Corpuscular Hemoglobin Conc (MCHC) 35.7 33.0 - 37.0 g/dL   Red Cell Distribution Width (RDW) 13.5 12.3 - 17.0 %   Platelet Count (PLT) 246 150 - 450 10*3/uL   Mean Platelet Volume (MPV) 7.3 6.8 - 10.2 fL  POC Glucose   Collection Time: 06/17/24  7:37 AM  Result Value Ref Range   Glucose, POC 96 70 - 99 mg/dL  POC Glucose   Collection Time: 06/17/24 11:01 AM  Result Value Ref Range   Glucose, POC 95 70 - 99 mg/dL  POC Glucose   Collection Time: 06/17/24  4:09 PM  Result Value Ref Range   Glucose, POC 163 (H) 70 - 99 mg/dL  Basic Metabolic Panel   Collection Time: 06/18/24 12:36 AM  Result Value Ref Range   Sodium 138 136 - 145 mmol/L   Potassium 4.8 (H) 3.4 - 4.5 mmol/L   Chloride 110 (H) 98 - 107  mmol/L   CO2 22 21 - 31 mmol/L   Anion Gap 6 6 - 14 mmol/L   Glucose, Random 91 70 - 99 mg/dL   Blood Urea Nitrogen (BUN) 48 (H) 7 - 25 mg/dL   Creatinine 6.64 (H) 9.29 - 1.30 mg/dL   eGFR 18 (L) >40 fO/fpw/8.26f7   Calcium  8.4 (L) 8.6 - 10.3 mg/dL   BUN/Creatinine Ratio 14.3 10.0 - 20.0  Magnesium   Collection Time: 06/18/24 12:36 AM  Result Value Ref Range   Magnesium 2.0 1.9 - 2.7 mg/dL  CBC without Differential   Collection Time: 06/18/24 12:36 AM  Result Value Ref Range   WBC 5.68 4.40 - 11.00 10*3/uL   RBC 2.41 (L) 4.50 - 5.90 10*6/uL   Hemoglobin 8.1 (L) 14.0 - 17.5 g/dL   Hematocrit 77.0 (L) 58.4 - 50.4 %   Mean Corpuscular Volume (MCV) 94.9 80.0 - 96.0 fL   Mean Corpuscular Hemoglobin (MCH) 33.4 (H) 27.5 - 33.2 pg   Mean Corpuscular Hemoglobin Conc (MCHC) 35.2 33.0 - 37.0 g/dL   Red Cell Distribution Width (RDW) 13.6 12.3 - 17.0 %   Platelet Count (PLT) 248 150 - 450 10*3/uL   Mean Platelet Volume (MPV) 7.5 6.8 - 10.2 fL   CT Biopsy Bone Marrow W/ Aspiration Result Date: 06/17/2024 INDICATION: Concern for multiple myeloma.  Please perform image guided bone marrow biopsy for tissue diagnostic purposes. EXAM: CT-GUIDED BONE MARROW BIOPSY AND ASPIRATION MEDICATIONS: None ANESTHESIA/SEDATION: Moderate (conscious) sedation was employed during this procedure as administered by an Interventional Radiology RN. A total of Versed  1.5 mg and  Fentanyl  75 mcg was administered intravenously. Moderate Sedation Time: 10 minutes. The patient's level of consciousness and vital signs were monitored continuously by radiology nursing throughout the procedure under my direct supervision. COMPLICATIONS: None PROCEDURE: Informed consent was obtained from the patient following an explanation of the procedure, risks, benefits and alternatives.  All questions were addressed.  A time out was performed prior to the initiation of the procedure.  The patient was positioned prone and non-contrast localization  CT was performed of the pelvis to demonstrate the iliac marrow spaces.  The operative site was prepped and draped in the usual sterile fashion.  Under sterile conditions and local anesthesia, a 22 gauge spinal needle was utilized for procedural planning.  Next, an 11 gauge coaxial bone biopsy needle was advanced into the left iliac marrow space.  Needle position was confirmed with CT imaging.  Initially, a bone marrow aspiration was performed. Next, a bone marrow biopsy was obtained with the 11 gauge outer bone marrow device.  The needle was removed and superficial hemostasis was obtained with manual compression.  A dressing was applied. The patient tolerated the procedure well without immediate post procedural complication.   Technically successful CT guided bone marrow aspiration and core biopsy.   CT Abdomen Pelvis WO Contrast Result Date: 06/03/2024 CLINICAL DATA:  Worsening kidney function following cystoscopy. Bladder spasms and hematuria. EXAM: CT ABDOMEN AND PELVIS WITHOUT CONTRAST TECHNIQUE: Multidetector CT imaging of the abdomen and pelvis was performed following the standard protocol without IV contrast. RADIATION DOSE REDUCTION: This exam was performed according to the departmental dose-optimization program which includes automated exposure control, adjustment of the mA and/or kV according to patient size and/or use of iterative reconstruction technique. COMPARISON:  05/20/2024. FINDINGS: Lower chest: Small bilateral pleural effusions with mild dependent atelectasis bilaterally. Atherosclerotic calcification of the aorta and coronary arteries. Heart is enlarged. No pericardial effusion. Air in the lower esophagus be seen with dysmotility. Slight thickening of the distal esophagus can be seen with gastroesophageal reflux disease. Hepatobiliary: Liver and gallbladder are unremarkable. No biliary ductal dilatation. Pancreas: Negative. Spleen: Negative. Adrenals/Urinary Tract: Adrenal glands are  unremarkable. Tiny right renal stone. Kidneys are otherwise unremarkable. Ureters are decompressed. Foley catheter and air in the bladder. There is mild perivesical inflammatory haziness and stranding along the ventral margin. Stomach/Bowel: Tiny hiatal hernia. Stomach, small bowel, appendix and colon are unremarkable. Vascular/Lymphatic: Circumaortic left renal vein. Atherosclerotic calcification of the aorta. No pathologically enlarged lymph nodes. Reproductive: Brachytherapy seeds in the prostate. Other: No free fluid. Musculoskeletal: Osteopenia. Degenerative changes in the spine. Mild levoconvex scoliosis. IMPRESSION: 1. Mild haziness along the ventral margin of the bladder may be due to cystitis. 2. Tiny right renal stone. 3. Small bilateral pleural effusions. 4. Aortic atherosclerosis (ICD10-I70.0). Coronary artery calcification. Electronically Signed   By: Newell Eke M.D.   On: 06/03/2024 14:06   Assessment and plan  79 year old male with history of parkinsonism, chronic kidney disease was found to have elevated serum light chain, M protein and was admitted to hospital for workup.  Bone marrow biopsy was done on 06/17/2024, results are pending.  # Plasma cell dyscrasia -- Workup for plasma cell dyscrasia was done because of worsening kidney function. -- Immunofixation showed monoclonal IgG kappa.  Protein electrophoresis showed 2.87 g/L of M protein.  Free light chain showed kappa light chain of 986 and lambda have 26 with ratio of 37.  Bone marrow biopsy was done on 06/17/2024 and results are pending. -- Pt has kidney dysfunction, anemia ,  he meets CRAB criteria of multiple myeloma.  Talked with myeloma physician Dr. Leatrice Cools at [facility] main campus.  Patient does not have any back pain/bony pain and kidney function is stable.  Plan was made to start him on dexamethasone  20 mg daily for 4 days which should help kidney function and he will be seen as outpatient at [facility]  with myeloma physician next week.  Will need PET scan to be done as outpatient.   Reviewed labs, discussed with Dr. Cools at [facility] main campus, discussed with inpatient team.   Discussion:   In total, 75 minutes of time was spent on this encounter, greater than 50% of which was spent face-to-face with Ubaldo VEAR Dollar for the history, physical exam, assessment and formulation of treatment plan.     This document was created using the aid of voice recognition Dragon dictation software, please excuse any sound alike substitutions, typographic or transcription errors.  Efforts have been made to correct these dictation errors, however some may persist, and this does not reflect the standard of medical care.  If there are any questions please do not hesitate to contact me for clarification.       [1] Patient Active Problem List Diagnosis   Pure hypercholesterolemia   Male erectile disorder   Essential hypertension   Diastasis recti   Stage 3a chronic kidney disease (CMD)   Resting tremor   Encounter for Medicare annual wellness exam   History of prostate cancer   Primary parkinsonism    (CMD)   Acute worsening of stage 4 chronic kidney disease (CMD)   Abnormal laboratory test   Monoclonal gammopathy   Anemia in stage 4 chronic kidney disease    (CMD)   Stage 4 chronic kidney disease    (CMD)   Primary hypertension   AKI (acute kidney injury)  [2] Past Medical History: Diagnosis Date   HL (hearing loss)    Hypercholesterolemia    Hypertension    Impingement syndrome, shoulder, right 05/19/2019   Malignant neoplasm of prostate    (CMD) 10/21/2015   Tubular adenoma of colon   [3] Past Surgical History: Procedure Laterality Date   ADENOIDECTOMY     APPENDECTOMY  11/18/1959   APPENDECTOMY aproximate date   EYE SURGERY     HERNIA REPAIR     KNEE ARTHROSCOPY Left 11/18/2007   approximate date   PROSTATE SURGERY     TONSILLECTOMY     Procedure:  TONSILLECTOMY   TONSILLECTOMY    [4] Allergies Allergen Reactions   Lisinopril Cough    Or tickle in throat  [5] Current Facility-Administered Medications  Medication Dose Route Frequency Provider Last Rate Last Admin   acetaminophen  (TYLENOL ) tablet 650 mg  650 mg oral Q6H PRN Eshwar Lal, MD       amLODIPine (NORVASC) tablet 2.5 mg  2.5 mg oral Daily Eshwar Lal, MD   2.5 mg at 06/17/24 0932   aspirin (ECOTRIN) EC tablet 325 mg  325 mg oral Daily Eshwar Lal, MD   325 mg at 06/17/24 0932   atorvastatin  (LIPITOR) tablet 20 mg  20 mg oral At Bedtime Ulyses Greener, MD   20 mg at 06/17/24 2129   calcium  carbonate-vitamin D3 500 mg (200 mg Ca)-5 mcg (200 units Vit D) per tablet 1 tablet  1 tablet oral BID with meals Eshwar Lal, MD   1 tablet at 06/17/24 1647   carbidopa -levodopa  (SINEMET ) 25-100 mg per tablet 1 tablet  1 tablet oral  Q4H Eshwar Lal, MD   1 tablet at 06/18/24 0536   dextrose (D50W) 50 % injection 12.5 g  12.5 g intravenous PRN Ulyses Greener, MD       dextrose (GLUTOSE) 40 % oral gel 15 g  15 g oral PRN Ulyses Greener, MD       ferrous sulfate  325 mg (65 mg iron) tablet 325 mg  325 mg oral Daily before breakfast Ulyses Greener, MD   325 mg at 06/17/24 9385   heparin (porcine) 5,000 unit/mL injection 5,000 Units  5,000 Units subcutaneous Q8H SCH Ulyses Greener, MD   5,000 Units at 06/18/24 0536   multivitamin with minerals tablet 1 tablet  1 tablet oral Daily Eshwar Lal, MD   1 tablet at 06/17/24 0932   nitroglycerin  (NITROSTAT ) SL tablet 0.4 mg  0.4 mg sublingual Q5 Min PRN Ulyses Greener, MD       ondansetron  (ZOFRAN ) injection 4 mg  4 mg intravenous Q6H PRN Ulyses Greener, MD       sodium chloride  0.9 % infusion  75 mL/hr intravenous Continuous Ulyses Greener, MD 75 mL/hr at 06/18/24 0343 75 mL/hr at 06/18/24 0343  [6] Social History Socioeconomic History   Marital status: Married  Tobacco Use   Smoking status: Never    Passive exposure: Never   Smokeless tobacco: Never  Vaping Use    Vaping status: Never Used  Substance and Sexual Activity   Alcohol use: Never   Drug use: Never   Sexual activity: Not Currently    Partners: Female  Social History Narrative   Wife: [name].    Poodle.   Retired from [employer], [place], [place]..  Mother, Nyzir Dubois, has dementia.   Social Drivers of Health   Living Situation: Low Risk (06/16/2024)   Living Situation    What is your living situation today?: I have a steady place to live    Think about the place you live. Do you have problems with any of the following? Choose all that apply:: None/None on this list  Food Insecurity: Low Risk (06/16/2024)   Food vital sign    Within the past 12 months, you worried that your food would run out before you got money to buy more: Never true    Within the past 12 months, the food you bought just didn't last and you didn't have money to get more: Never true  Transportation Needs: No Transportation Needs (06/16/2024)   Transportation    In the past 12 months, has lack of reliable transportation kept you from medical appointments, meetings, work or from getting things needed for daily living? : No  Utilities: Low Risk (06/16/2024)   Utilities    In the past 12 months has the electric, gas, oil, or water company threatened to shut off services in your home? : No  Safety: Low Risk (06/16/2024)   Safety    How often does anyone, including family and friends, physically hurt you?: Never    How often does anyone, including family and friends, insult or talk down to you?: Never    How often does anyone, including family and friends, threaten you with harm?: Never    How often does anyone, including family and friends, scream or curse at you?: Never  Alcohol Screening: Not At Risk (03/13/2024)   Alcohol    Audit C Alcohol risk score: 0  Tobacco Use: Low Risk (06/16/2024)   Patient History    Smoking Tobacco Use: Never  Smokeless Tobacco Use: Never    Passive  Exposure: Never  Depression: Not At Risk (05/12/2024)   PHQ-2    PHQ-2 Score: 0  Social Connections: Socially Integrated (06/16/2024)   Social Connection and Isolation Panel    Frequency of Communication with Friends and Family: Twice a week    Frequency of Social Gatherings with Friends and Family: Twice a week    Attends Religious Services: More than 4 times per year    Active Member of [employer] or Organizations: Yes    Attends Engineer, Structural: More than 4 times per year    Marital Status: Married  Physicist, Medical Strain: Low Risk (06/16/2024)   Overall Financial Resource Strain (CARDIA)    Difficulty of Paying Living Expenses: Not very hard  [7] Family History Problem Relation Name Age of Onset   Cancer Father Esley Brooking   "

## 2024-08-20 ENCOUNTER — Ambulatory Visit: Admitting: Neurology
